# Patient Record
Sex: Male | Born: 1980 | Race: White | Hispanic: No | Marital: Married | Smoking: Never smoker
Health system: Southern US, Community
[De-identification: ages and names within clinical notes are randomized; demographics above are authoritative.]

## PROBLEM LIST (undated history)

## (undated) DIAGNOSIS — F419 Anxiety disorder, unspecified: Secondary | ICD-10-CM

## (undated) DIAGNOSIS — T63301A Toxic effect of unspecified spider venom, accidental (unintentional), initial encounter: Secondary | ICD-10-CM

## (undated) DIAGNOSIS — N200 Calculus of kidney: Secondary | ICD-10-CM

## (undated) DIAGNOSIS — T4145XA Adverse effect of unspecified anesthetic, initial encounter: Secondary | ICD-10-CM

## (undated) DIAGNOSIS — K5090 Crohn's disease, unspecified, without complications: Secondary | ICD-10-CM

## (undated) DIAGNOSIS — K219 Gastro-esophageal reflux disease without esophagitis: Secondary | ICD-10-CM

## (undated) DIAGNOSIS — Z8619 Personal history of other infectious and parasitic diseases: Secondary | ICD-10-CM

## (undated) DIAGNOSIS — T8859XA Other complications of anesthesia, initial encounter: Secondary | ICD-10-CM

## (undated) HISTORY — DX: Gastro-esophageal reflux disease without esophagitis: K21.9

## (undated) HISTORY — DX: Toxic effect of unspecified spider venom, accidental (unintentional), initial encounter: T63.301A

## (undated) HISTORY — DX: Calculus of kidney: N20.0

## (undated) HISTORY — DX: Crohn's disease, unspecified, without complications: K50.90

## (undated) HISTORY — PX: MOLE REMOVAL: SHX2046

## (undated) HISTORY — DX: Personal history of other infectious and parasitic diseases: Z86.19

## (undated) HISTORY — PX: APPENDECTOMY: SHX54

## (undated) HISTORY — DX: Anxiety disorder, unspecified: F41.9

---

## 2001-04-07 ENCOUNTER — Emergency Department (HOSPITAL_COMMUNITY): Admission: EM | Admit: 2001-04-07 | Discharge: 2001-04-07 | Payer: Self-pay | Admitting: Emergency Medicine

## 2004-06-20 ENCOUNTER — Ambulatory Visit: Payer: Self-pay | Admitting: Internal Medicine

## 2005-12-04 ENCOUNTER — Ambulatory Visit: Payer: Self-pay | Admitting: Family Medicine

## 2009-09-02 ENCOUNTER — Inpatient Hospital Stay (HOSPITAL_COMMUNITY): Admission: EM | Admit: 2009-09-02 | Discharge: 2009-09-03 | Payer: Self-pay | Admitting: Emergency Medicine

## 2009-09-02 ENCOUNTER — Encounter (INDEPENDENT_AMBULATORY_CARE_PROVIDER_SITE_OTHER): Payer: Self-pay

## 2010-03-20 HISTORY — PX: COLON SURGERY: SHX602

## 2010-06-05 LAB — CBC
HCT: 42.7 % (ref 39.0–52.0)
Platelets: 224 10*3/uL (ref 150–400)
RBC: 5.16 MIL/uL (ref 4.22–5.81)
WBC: 15.8 10*3/uL — ABNORMAL HIGH (ref 4.0–10.5)

## 2010-06-05 LAB — BASIC METABOLIC PANEL
BUN: 13 mg/dL (ref 6–23)
GFR calc Af Amer: >60 mL/min (ref >60)
GFR calc non Af Amer: >60 mL/min (ref >60)
Potassium: 3.4 mEq/L — ABNORMAL LOW (ref 3.5–5.1)

## 2010-06-05 LAB — URINALYSIS, ROUTINE W REFLEX MICROSCOPIC
Ketones, ur: 15 mg/dL — AB
Nitrite: NEGATIVE
Specific Gravity, Urine: 1.03 (ref 1.005–1.030)
pH: 6.5 (ref 5.0–8.0)

## 2010-06-05 LAB — DIFFERENTIAL
Eosinophils Relative: 1 % (ref 0–5)
Lymphocytes Relative: 6 % — ABNORMAL LOW (ref 12–46)
Lymphs Abs: 1 10*3/uL (ref 0.7–4.0)
Neutrophils Relative %: 88 % — ABNORMAL HIGH (ref 43–77)

## 2011-01-29 ENCOUNTER — Ambulatory Visit: Payer: Self-pay | Admitting: Family Medicine

## 2011-01-31 ENCOUNTER — Emergency Department (HOSPITAL_COMMUNITY)
Admission: EM | Admit: 2011-01-31 | Discharge: 2011-01-31 | Disposition: A | Discharge status: Home / Self Care | Payer: 59 | Attending: Emergency Medicine | Admitting: Emergency Medicine

## 2011-01-31 ENCOUNTER — Emergency Department (HOSPITAL_COMMUNITY): Payer: 59

## 2011-01-31 ENCOUNTER — Encounter: Payer: Self-pay | Admitting: *Deleted

## 2011-01-31 DIAGNOSIS — R1013 Epigastric pain: Secondary | ICD-10-CM | POA: Insufficient documentation

## 2011-01-31 DIAGNOSIS — R112 Nausea with vomiting, unspecified: Secondary | ICD-10-CM | POA: Insufficient documentation

## 2011-01-31 DIAGNOSIS — R1011 Right upper quadrant pain: Secondary | ICD-10-CM | POA: Insufficient documentation

## 2011-01-31 LAB — DIFFERENTIAL
Eosinophils Relative: 3 % (ref 0–5)
Lymphocytes Relative: 19 % (ref 12–46)
Lymphs Abs: 1.9 10*3/uL (ref 0.7–4.0)
Monocytes Absolute: 0.9 10*3/uL (ref 0.1–1.0)

## 2011-01-31 LAB — COMPREHENSIVE METABOLIC PANEL
CO2: 28 mEq/L (ref 19–32)
Calcium: 9.6 mg/dL (ref 8.4–10.5)
Creatinine, Ser: 1.1 mg/dL (ref 0.50–1.35)
GFR calc Af Amer: >90 mL/min (ref >90)
GFR calc non Af Amer: 89 mL/min — ABNORMAL LOW (ref >90)
Glucose, Bld: 85 mg/dL (ref 70–99)

## 2011-01-31 LAB — CBC
HCT: 46.6 % (ref 39.0–52.0)
MCV: 79.8 fL (ref 78.0–100.0)
RBC: 5.84 MIL/uL — ABNORMAL HIGH (ref 4.22–5.81)
WBC: 9.8 10*3/uL (ref 4.0–10.5)

## 2011-01-31 MED ORDER — HYDROCODONE-ACETAMINOPHEN 5-325 MG PO TABS: 2.0000 | ORAL_TABLET | ORAL | Status: DC | PRN

## 2011-01-31 MED ORDER — FAMOTIDINE 20 MG PO TABS: 20.0000 mg | ORAL_TABLET | Freq: Two times a day (BID) | ORAL | Status: DC

## 2011-01-31 MED ORDER — HYDROMORPHONE HCL PF 1 MG/ML IJ SOLN
1.0000 mg | Freq: Once | INTRAMUSCULAR | Status: DC
  Filled 2011-01-31: qty 1

## 2011-01-31 MED ORDER — ONDANSETRON HCL 4 MG PO TABS: 8.0000 mg | ORAL_TABLET | Freq: Three times a day (TID) | ORAL | Status: DC | PRN

## 2011-01-31 MED ORDER — ONDANSETRON 4 MG PO TBDP
8.0000 mg | ORAL_TABLET | Freq: Once | ORAL | Status: DC
  Filled 2011-01-31: qty 1

## 2011-01-31 NOTE — ED Notes (Signed)
Pt c/o epigastric abd pain x 3 weeks; worse when lying or after eating. Was seen at [facility] yesterday for lab draw - does not know the results.

## 2011-01-31 NOTE — ED Provider Notes (Signed)
History     CSN: [id number] Arrival date & time: 01/31/2011  9:53 AM   First MD Initiated Contact with Patient 01/31/11 1035      Chief Complaint  Patient presents with  . Abdominal Pain    epigastric   Patient is a 30 y.o. male presenting with abdominal pain.  Abdominal Pain The primary symptoms of the illness include abdominal pain, nausea and vomiting. The primary symptoms of the illness do not include fever, shortness of breath, diarrhea, hematemesis or dysuria.  Symptoms associated with the illness do not include chills, diaphoresis, constipation, urgency or back pain.   Patient seen and evaluated at 10 am. Patient reports that He has had epigastric abdominal pain for the past three weeks. Worse with lying down or after eating. Patient was seen at the urgent care in [place] for this as well. Denies that the pain is exertional in nature denies any exertional dyspnea. Patient reports some associated nausea. Patient states that the pain started last night after eating a greasy meal. Pain is located in the epigastric to right upper quadrant. Patient denies any diarrhea or rectal bleeding. Patient denies any fevers or chills. Patient denies any other complaints. History reviewed. No pertinent past medical history.  Past Surgical History  Procedure Date  . Appendectomy     History reviewed. No pertinent family history.  History  Substance Use Topics  . Smoking status: Never Smoker   . Smokeless tobacco: Not on file  . Alcohol Use: No      Review of Systems  Constitutional: Negative for fever, chills, diaphoresis and appetite change.  HENT: Negative for neck pain.   Eyes: Negative for photophobia and visual disturbance.  Respiratory: Negative for cough, chest tightness and shortness of breath.   Cardiovascular: Negative for chest pain.  Gastrointestinal: Positive for nausea, vomiting and abdominal pain. Negative for diarrhea, constipation and hematemesis.  Genitourinary:  Negative for dysuria, urgency, flank pain, decreased urine volume, discharge, penile swelling, scrotal swelling, difficulty urinating, penile pain and testicular pain.  Musculoskeletal: Negative for back pain.  Skin: Negative for rash.  Neurological: Negative for weakness and numbness.  All other systems reviewed and are negative.    Allergies  Review of patient's allergies indicates no known allergies.  Home Medications  No current outpatient prescriptions on file.  BP 115/62  Pulse 72  Temp(Src) 98 F (36.7 C) (Oral)  Resp 12  SpO2 100%  Physical Exam  Nursing note and vitals reviewed. Constitutional: He is oriented to person, place, and time. He appears well-developed and well-nourished. No distress.  HENT:  Head: Normocephalic and atraumatic.  Eyes: EOM are normal. Pupils are equal, round, and reactive to light.  Neck: Normal range of motion. Neck supple. No JVD present. No tracheal deviation present.  Cardiovascular: Normal rate and regular rhythm.  Exam reveals no gallop and no friction rub.   No murmur heard. Pulmonary/Chest: Effort normal and breath sounds normal. No respiratory distress. He has no wheezes. He exhibits no tenderness.  Abdominal: Soft. Bowel sounds are normal. There is no hepatosplenomegaly, splenomegaly or hepatomegaly. There is tenderness in the right upper quadrant. There is positive Murphy's sign. There is no rebound, no CVA tenderness and no tenderness at McBurney's point. No hernia. Hernia confirmed negative in the ventral area, confirmed negative in the right inguinal area and confirmed negative in the left inguinal area.       Tenderness is mild. No appendix. Abdominal scar noted at the umbilicus.  Genitourinary: Prostate normal. No  penile tenderness.       Chaperone present.  Musculoskeletal: Normal range of motion.  Lymphadenopathy:    He has no cervical adenopathy.  Neurological: He is alert and oriented to person, place, and time.  Skin: Skin  is warm and dry. No rash noted. He is not diaphoretic. No erythema. No pallor.  Psychiatric: He has a normal mood and affect. His behavior is normal. Judgment and thought content normal.    ED Course  Procedures (including critical care time)  Patient seen and evaluated.  VSS reviewed. . Nursing notes reviewed. Discussed with attending physician. Initial testing ordered. Will monitor the patient closely. They agree with the treatment plan and diagnosis. [place] of abdomen ordered to r/o gallstones.  Patient seen and re-evaluated. Resting comfortably. VSS stable. NAD. Patient notified of testing results. Stated agreement and understanding. Patient stated understanding to treatment plan and diagnosis.   1:27 PM patient seen and re-evaluated. Resting comfortably. No abnormalities on labs or imaging. No tenderness to palpation on re-examination. Will have patient follow up with his primary care physician or GI specialist for further evaluation. Patient advised of warning signs to return. Stated agreement and understanding.  Patient Vitals for the past 24 hrs:  BP Temp Temp src Pulse Resp SpO2  01/31/11 0955 115/62 mmHg 98 F (36.7 C) Oral 72  12  100 %   Results for orders placed during the hospital encounter of 01/31/11  CBC      Component Value Range   WBC 9.8  4.0 - 10.5 (K/uL)   RBC 5.84 (*) 4.22 - 5.81 (MIL/uL)   Hemoglobin 15.9  13.0 - 17.0 (g/dL)   HCT 46.6  39.0 - 52.0 (%)   MCV 79.8  78.0 - 100.0 (fL)   MCH 27.2  26.0 - 34.0 (pg)   MCHC 34.1  30.0 - 36.0 (g/dL)   RDW 12.9  11.5 - 15.5 (%)   Platelets 256  150 - 400 (K/uL)  DIFFERENTIAL      Component Value Range   Neutrophils Relative 69  43 - 77 (%)   Neutro Abs 6.8  1.7 - 7.7 (K/uL)   Lymphocytes Relative 19  12 - 46 (%)   Lymphs Abs 1.9  0.7 - 4.0 (K/uL)   Monocytes Relative 9  3 - 12 (%)   Monocytes Absolute 0.9  0.1 - 1.0 (K/uL)   Eosinophils Relative 3  0 - 5 (%)   Eosinophils Absolute 0.3  0.0 - 0.7 (K/uL)   Basophils  Relative 0  0 - 1 (%)   Basophils Absolute 0.0  0.0 - 0.1 (K/uL)  COMPREHENSIVE METABOLIC PANEL      Component Value Range   Sodium 138  135 - 145 (mEq/L)   Potassium 3.8  3.5 - 5.1 (mEq/L)   Chloride 101  96 - 112 (mEq/L)   CO2 28  19 - 32 (mEq/L)   Glucose, Bld 85  70 - 99 (mg/dL)   BUN 14  6 - 23 (mg/dL)   Creatinine, Ser 1.10  0.50 - 1.35 (mg/dL)   Calcium 9.6  8.4 - 10.5 (mg/dL)   Total Protein 7.8  6.0 - 8.3 (g/dL)   Albumin 3.7  3.5 - 5.2 (g/dL)   AST 15  0 - 37 (U/L)   ALT 19  0 - 53 (U/L)   Alkaline Phosphatase 83  39 - 117 (U/L)   Total Bilirubin 0.5  0.3 - 1.2 (mg/dL)   GFR calc non Af Amer 89 (*) >90 (  mL/min)   GFR calc Af Amer >90  >90 (mL/min)  LIPASE, BLOOD      Component Value Range   Lipase 25  11 - 59 (U/L)   US Abdomen Complete  01/31/2011  *RADIOLOGY REPORT*  Clinical Data:  Right upper quadrant pain.  ABDOMINAL ULTRASOUND COMPLETE  Comparison:  None.  Findings:  Gallbladder:  No gallstones, gallbladder wall thickening, or pericholecystic fluid.  Common Bile Duct:  Within normal limits in caliber. Measures 5 mm in diameter.  Liver: No focal mass lesion identified.  Within normal limits in parenchymal echogenicity.  IVC:  Appears normal.  Pancreas:  No abnormality identified.  Spleen:  Within normal limits in size and echotexture.  Right kidney:  Normal in size and parenchymal echogenicity.  No evidence of mass or hydronephrosis.  Left kidney:  Normal in size and parenchymal echogenicity.  No evidence of mass or hydronephrosis.  Abdominal Aorta:  No aneurysm identified.  IMPRESSION: Negative abdominal ultrasound.  Original Report Authenticated By: Marlaine Hind, M.D.      MDM  Epigastric abdominal pain        Benson Setting, [place] 01/31/11 1330

## 2011-01-31 NOTE — ED Notes (Signed)
PT REFUSES PAIN AND NAUSEA MED AT THIS TIME.

## 2011-01-31 NOTE — ED Notes (Signed)
PATIENT REPORTS HE HAS BEEN HAVING INTERMITTENT EPIGASTRIC PAIN FOR THE PAST 3-4 WEEKS. STATES USUALLY STARTS AFTER HE EATS LUNCH.[place] WITH FRIED FOODS . STATES SEEMS TO BE WORSE AT NIGHT WHEN HE TRIES TO REST. DENIES NAUSEA OR VOMITING OR DIARRHEA WITH THE PAIN.DENIES FEVER. MUCOUS MEMBRANES ARE PINK AND MOIST. ABDOMEN SOFT AND NONTENDER. BOWEL SOUNDS ACTIVE. DENIES ANY PAIN OR SYMPTOMS AT THIS TIME

## 2011-02-01 ENCOUNTER — Encounter: Payer: Self-pay | Admitting: Internal Medicine

## 2011-02-01 NOTE — ED Provider Notes (Signed)
Medical screening examination/treatment/procedure(s) were performed by non-physician practitioner and as supervising physician I was immediately available for consultation/collaboration.  Virgel Manifold, MD 02/01/11 [phone]

## 2011-02-05 ENCOUNTER — Encounter (HOSPITAL_COMMUNITY): Payer: Self-pay | Admitting: Emergency Medicine

## 2011-02-05 ENCOUNTER — Emergency Department (HOSPITAL_COMMUNITY): Payer: 59

## 2011-02-05 ENCOUNTER — Emergency Department (HOSPITAL_COMMUNITY)
Admission: EM | Admit: 2011-02-05 | Discharge: 2011-02-05 | Disposition: A | Discharge status: Home / Self Care | Payer: 59 | Attending: Emergency Medicine | Admitting: Emergency Medicine

## 2011-02-05 DIAGNOSIS — K5289 Other specified noninfective gastroenteritis and colitis: Secondary | ICD-10-CM | POA: Insufficient documentation

## 2011-02-05 DIAGNOSIS — R634 Abnormal weight loss: Secondary | ICD-10-CM | POA: Insufficient documentation

## 2011-02-05 DIAGNOSIS — R1011 Right upper quadrant pain: Secondary | ICD-10-CM | POA: Insufficient documentation

## 2011-02-05 DIAGNOSIS — K529 Noninfective gastroenteritis and colitis, unspecified: Secondary | ICD-10-CM

## 2011-02-05 DIAGNOSIS — R110 Nausea: Secondary | ICD-10-CM | POA: Insufficient documentation

## 2011-02-05 LAB — COMPREHENSIVE METABOLIC PANEL
ALT: 26 U/L (ref 0–53)
AST: 21 U/L (ref 0–37)
Albumin: 3.8 g/dL (ref 3.5–5.2)
CO2: 27 mEq/L (ref 19–32)
Calcium: 9.5 mg/dL (ref 8.4–10.5)
GFR calc non Af Amer: >90 mL/min (ref >90)
Sodium: 139 mEq/L (ref 135–145)

## 2011-02-05 LAB — CBC
MCH: 27.6 pg (ref 26.0–34.0)
Platelets: 271 10*3/uL (ref 150–400)
RBC: 5.97 MIL/uL — ABNORMAL HIGH (ref 4.22–5.81)
WBC: 13.1 10*3/uL — ABNORMAL HIGH (ref 4.0–10.5)

## 2011-02-05 MED ORDER — GI COCKTAIL ~~LOC~~
30.0000 mL | Freq: Once | ORAL | Status: AC
  Administered 2011-02-05: 30 mL via ORAL
  Filled 2011-02-05: qty 30

## 2011-02-05 MED ORDER — IOHEXOL 300 MG/ML  SOLN
100.0000 mL | Freq: Once | INTRAMUSCULAR | Status: AC | PRN
  Administered 2011-02-05: 100 mL via INTRAVENOUS

## 2011-02-05 MED ORDER — ONDANSETRON HCL 4 MG/2ML IJ SOLN
4.0000 mg | Freq: Once | INTRAMUSCULAR | Status: AC
  Administered 2011-02-05: 4 mg via INTRAVENOUS
  Filled 2011-02-05: qty 2

## 2011-02-05 MED ORDER — OXYCODONE-ACETAMINOPHEN 5-325 MG PO TABS
2.0000 | ORAL_TABLET | Freq: Once | ORAL | Status: AC
  Administered 2011-02-05: 2 via ORAL
  Filled 2011-02-05: qty 2

## 2011-02-05 MED ORDER — OXYCODONE-ACETAMINOPHEN 5-325 MG PO TABS: 2.0000 | ORAL_TABLET | ORAL | Status: DC | PRN

## 2011-02-05 MED ORDER — SODIUM CHLORIDE 0.9 % IV SOLN
Freq: Once | INTRAVENOUS | Status: AC
  Administered 2011-02-05: 06:00:00 via INTRAVENOUS

## 2011-02-05 MED ORDER — SODIUM CHLORIDE 0.9 % IV BOLUS (SEPSIS)
1000.0000 mL | Freq: Once | INTRAVENOUS | Status: AC
  Administered 2011-02-05: 1000 mL via INTRAVENOUS

## 2011-02-05 MED ORDER — MORPHINE SULFATE 4 MG/ML IJ SOLN
4.0000 mg | Freq: Once | INTRAMUSCULAR | Status: AC
  Administered 2011-02-05: 4 mg via INTRAVENOUS
  Filled 2011-02-05: qty 1

## 2011-02-05 NOTE — ED Notes (Signed)
See blank note

## 2011-02-05 NOTE — ED Notes (Signed)
Received report from [name], Therapist, sports. Pt has finished drinking contrast and is waiting to go for ct scan of abd/pelvis

## 2011-02-05 NOTE — ED Provider Notes (Signed)
History     CSN: [id number] Arrival date & time: 02/05/2011  3:55 AM   None     Chief Complaint  Patient presents with  . Abdominal Pain    (Consider location/radiation/quality/duration/timing/severity/associated sxs/prior treatment) HPI History provided by pt.   Pt c/o severe, intermittent RUQ pain x 1 month.  In the past week, pain has started to radiate straight through to back and right scapula and is now associated w/ nausea.  Pain worse at night when he lays down and is also aggravated by eating.  Has lost 10lbs in past month because afraid to eat.  Some relief w/ percocet which his father has given him.  Denies fever, cough, SOB, vomiting, diarrhea, blood in stool, urinary sx.  Past abd surgeries include appendectomy.  Does not drink alcohol.  Per prior chart, pt seen for same in ED on 01/31/11 and had a nml abd [place].  Referred to GI and appt scheduled for 02/24/11.  History reviewed. No pertinent past medical history.  Past Surgical History  Procedure Date  . Appendectomy     History reviewed. No pertinent family history.  History  Substance Use Topics  . Smoking status: Never Smoker   . Smokeless tobacco: Not on file  . Alcohol Use: No      Review of Systems  All other systems reviewed and are negative.    Allergies  Review of patient's allergies indicates no known allergies.  Home Medications   Current Outpatient Rx  Name Route Sig Dispense Refill  . FAMOTIDINE 20 MG PO TABS Oral Take 1 tablet (20 mg total) by mouth 2 (two) times daily. 30 tablet 0  . HYDROCODONE-ACETAMINOPHEN 5-325 MG PO TABS Oral Take 2 tablets by mouth every 4 (four) hours as needed for pain. 6 tablet 0    BP 118/65  Pulse 84  Temp(Src) 97.6 F (36.4 C) (Oral)  Resp 20  Ht 6' (1.829 m)  Wt 156 lb (70.761 kg)  BMI 21.16 kg/m2  SpO2 100%  Physical Exam  Nursing note and vitals reviewed. Constitutional: He is oriented to person, place, and time. He appears well-developed and  well-nourished. No distress.  HENT:  Head: Normocephalic and atraumatic.  Eyes:       Normal appearance  Neck: Normal range of motion.  Cardiovascular: Normal rate and regular rhythm.   Pulmonary/Chest: Effort normal and breath sounds normal.  Abdominal: Soft. Bowel sounds are normal. He exhibits no distension and no mass. There is no rebound.       Surgical scar inferior to umbilicus.  Pt points to pain RUQ.  Exam limited b/c pt will not relax abd muscles.  Tenderness RUQ and RLQ only.  No CVA tenderness.    Neurological: He is alert and oriented to person, place, and time.  Skin: Skin is warm and dry. No rash noted.  Psychiatric: He has a normal mood and affect. His behavior is normal.    ED Course  Procedures (including critical care time)  Labs Reviewed  CBC - Abnormal; Notable for the following:    WBC 13.1 (*)    RBC 5.97 (*)    All other components within normal limits  COMPREHENSIVE METABOLIC PANEL  LIPASE, BLOOD   Ct Abdomen Pelvis W Contrast  02/05/2011  *RADIOLOGY REPORT*  Clinical Data: Progressive right epigastric pain  CT ABDOMEN AND PELVIS WITH CONTRAST  Technique:  Multidetector CT imaging of the abdomen and pelvis was performed following the standard protocol during bolus administration of intravenous contrast.  Contrast:  100 ml Omnipaque-300 IV  Comparison: None.  Findings: Visualized lung bases clear.  Gallbladder is physiologically distended.  There is mild prominence of intrahepatic biliary ducts in the lateral left hepatic segment. The common duct appears decompressed throughout its length.  No focal liver lesion.  Unremarkable spleen, adrenal glands, pancreas, kidneys, abdominal aorta.  Portal vein is patent.  Stomach and proximal small bowel are decompressed, unremarkable.  There is circumferential marked wall thickening in the distal terminal ileum extending over a length of approximately  15 - 20 cm to the level of the ileocecal valve.  This results in moderate  luminal narrowing several cm proximal to the ileocecal valve. However, the oral contrast does pass on distally into the colon.  There   is a 3cm focus of inflammatory/edematous change medial to the involved small bowel segment, with a few scattered extraluminal gas bubbles and fluid but no discrete drainable abscess.  There are regional enlarged mesenteric lymph nodes.  No free air.  There is a trace amount of pelvic ascites.  The colon is decompressed, unremarkable.  Urinary bladder is physiologically distended.  No pelvic adenopathy.  There are a few prominent sub centimeter left para-aortic lymph nodes.  Regional bones unremarkable.  IMPRESSION:  1.  Long segment of abnormal distal ileum with circumferential wall thickening and contained perforation but no discrete drainable abscess or high-grade obstruction.  Findings suggest inflammatory bowel disease.  Small bowel lymphoma could have a similar appearance.  Follow up recommended.  Original Report Authenticated By: Dillard Cannon III, M.D.     1. Inflammatory bowel disease       MDM  Pt presents to ED for second time in one week w/ RUQ pain.  Associated w/ nausea and weight loss. Neg abd [place] last visit.  Appt w/ GI 12/7.  On exam, afebrile, NAD, lungs CTA, RUQ/RLQ ttp, no CVA tenderness.  Mild leukocytosis but labs otherwise unremarkable.  Pt received a GI cocktail w/out relief.  He declines any more pain medication at this time.  Discussed w/ Dr. Thad Ranger who recommends CT abd/pelvis.  Will move pt to CDU.  7:16 AM        Remer Macho, PA 02/05/11 1912

## 2011-02-05 NOTE — ED Notes (Signed)
Waiting for ct results to come back. Pt resting at bedside with family at bedside.

## 2011-02-05 NOTE — ED Notes (Signed)
Patient presents with c/o pain to right epigastric area.  Has been getting worse over the alst month or so  Has been seen here and urgent care for the same.  States pain is worse after eating fried food

## 2011-02-05 NOTE — ED Notes (Signed)
Patient transported to CT 

## 2011-02-05 NOTE — ED Provider Notes (Signed)
Medical screening examination/treatment/procedure(s) were performed by non-physician practitioner and as supervising physician I was immediately available for consultation/collaboration.  Babette Relic, MD 02/05/11 [phone]

## 2011-02-05 NOTE — ED Notes (Signed)
Patient  States his pain is some better but then it gets real bad again

## 2011-02-05 NOTE — ED Provider Notes (Signed)
History     CSN: [id number] Arrival date & time: 02/05/2011  3:55 AM   First MD Initiated Contact with Patient 02/05/11 [phone]      Chief Complaint  Patient presents with  . Abdominal Pain    (Consider location/radiation/quality/duration/timing/severity/associated sxs/prior treatment) HPI  History reviewed. No pertinent past medical history.  Past Surgical History  Procedure Date  . Appendectomy     History reviewed. No pertinent family history.  History  Substance Use Topics  . Smoking status: Never Smoker   . Smokeless tobacco: Not on file  . Alcohol Use: No      Review of Systems  Allergies  Review of patient's allergies indicates no known allergies.  Home Medications   Current Outpatient Rx  Name Route Sig Dispense Refill  . FAMOTIDINE 20 MG PO TABS Oral Take 1 tablet (20 mg total) by mouth 2 (two) times daily. 30 tablet 0  . HYDROCODONE-ACETAMINOPHEN 5-325 MG PO TABS Oral Take 2 tablets by mouth every 4 (four) hours as needed for pain. 6 tablet 0    BP 109/68  Pulse 82  Temp(Src) 97.6 F (36.4 C) (Oral)  Resp 18  Ht 6' (1.829 m)  Wt 156 lb (70.761 kg)  BMI 21.16 kg/m2  SpO2 98%  Physical Exam  ED Course  Procedures (including critical care time)  Labs Reviewed  CBC - Abnormal; Notable for the following:    WBC 13.1 (*)    RBC 5.97 (*)    All other components within normal limits  COMPREHENSIVE METABOLIC PANEL  LIPASE, BLOOD   No results found.   Inflammatory bowel disease vs. lymphoma   MDM   I have discussed the results of the CT scan with Dr. Benson Norway with [facility].  He does not feel that we should place the patient on steroids at this time, as we do not know what is going on with the patient.  The patient clinically looks well, is able to tolerate po's so will send him home with stronger pain control, diet plan and for him to call Dr. Ulyses Amor office tomorrow to try and get an earlier appointment.       Idalia Needle [place],  [place] 02/05/11 1130

## 2011-02-06 NOTE — ED Provider Notes (Signed)
Medical screening examination/treatment/procedure(s) were performed by non-physician practitioner and as supervising physician I was immediately available for consultation/collaboration.   Lezlie Octave, MD 02/06/11 [phone]

## 2011-02-07 ENCOUNTER — Inpatient Hospital Stay (HOSPITAL_COMMUNITY)
Admission: AD | Admit: 2011-02-07 | Discharge: 2011-02-10 | DRG: 387 | Disposition: A | Discharge status: Home / Self Care | Payer: 59 | Source: Ambulatory Visit | Attending: Internal Medicine | Admitting: Internal Medicine

## 2011-02-07 ENCOUNTER — Ambulatory Visit (INDEPENDENT_AMBULATORY_CARE_PROVIDER_SITE_OTHER): Payer: 59 | Admitting: Gastroenterology

## 2011-02-07 ENCOUNTER — Encounter (HOSPITAL_COMMUNITY): Payer: Self-pay | Admitting: *Deleted

## 2011-02-07 ENCOUNTER — Encounter: Payer: Self-pay | Admitting: Gastroenterology

## 2011-02-07 VITALS — BP 108/68 | HR 80 | Ht 72.0 in | Wt 153.6 lb

## 2011-02-07 DIAGNOSIS — D72829 Elevated white blood cell count, unspecified: Secondary | ICD-10-CM | POA: Diagnosis present

## 2011-02-07 DIAGNOSIS — K5090 Crohn's disease, unspecified, without complications: Principal | ICD-10-CM | POA: Diagnosis present

## 2011-02-07 DIAGNOSIS — R1031 Right lower quadrant pain: Secondary | ICD-10-CM

## 2011-02-07 DIAGNOSIS — R933 Abnormal findings on diagnostic imaging of other parts of digestive tract: Secondary | ICD-10-CM

## 2011-02-07 HISTORY — DX: Other complications of anesthesia, initial encounter: T88.59XA

## 2011-02-07 HISTORY — DX: Adverse effect of unspecified anesthetic, initial encounter: T41.45XA

## 2011-02-07 LAB — COMPREHENSIVE METABOLIC PANEL
ALT: 23 U/L (ref 0–53)
AST: 17 U/L (ref 0–37)
Albumin: 3.5 g/dL (ref 3.5–5.2)
Calcium: 9.5 mg/dL (ref 8.4–10.5)
Chloride: 98 mEq/L (ref 96–112)
Creatinine, Ser: 1.16 mg/dL (ref 0.50–1.35)
Sodium: 136 mEq/L (ref 135–145)
Total Bilirubin: 0.3 mg/dL (ref 0.3–1.2)

## 2011-02-07 LAB — CBC
Hemoglobin: 15 g/dL (ref 13.0–17.0)
MCH: 26.6 pg (ref 26.0–34.0)
MCV: 79.1 fL (ref 78.0–100.0)
RBC: 5.64 MIL/uL (ref 4.22–5.81)

## 2011-02-07 LAB — SEDIMENTATION RATE: Sed Rate: 13 mm/hr (ref 0–16)

## 2011-02-07 MED ORDER — PIPERACILLIN-TAZOBACTAM 3.375 G IVPB
3.3750 g | Freq: Three times a day (TID) | INTRAVENOUS | Status: DC
  Administered 2011-02-07 – 2011-02-10 (×9): 3.375 g via INTRAVENOUS
  Filled 2011-02-07 (×12): qty 50

## 2011-02-07 MED ORDER — METHYLPREDNISOLONE SODIUM SUCC 40 MG IJ SOLR
40.0000 mg | Freq: Every day | INTRAMUSCULAR | Status: DC
  Administered 2011-02-07 – 2011-02-10 (×4): 40 mg via INTRAVENOUS
  Filled 2011-02-07 (×4): qty 1

## 2011-02-07 MED ORDER — ACETAMINOPHEN 650 MG RE SUPP: 650.0000 mg | Freq: Four times a day (QID) | RECTAL | Status: DC | PRN

## 2011-02-07 MED ORDER — ONDANSETRON HCL 4 MG PO TABS: 4.0000 mg | ORAL_TABLET | Freq: Four times a day (QID) | ORAL | Status: DC | PRN

## 2011-02-07 MED ORDER — HYDROMORPHONE HCL PF 2 MG/ML IJ SOLN
1.0000 mg | INTRAMUSCULAR | Status: DC | PRN
  Administered 2011-02-07 – 2011-02-08 (×2): 1 mg via INTRAVENOUS
  Filled 2011-02-07 (×2): qty 1

## 2011-02-07 MED ORDER — ACETAMINOPHEN 325 MG PO TABS
650.0000 mg | ORAL_TABLET | Freq: Four times a day (QID) | ORAL | Status: DC | PRN
  Administered 2011-02-08: 650 mg via ORAL
  Filled 2011-02-07 (×2): qty 2

## 2011-02-07 MED ORDER — KCL IN DEXTROSE-NACL 10-5-0.45 MEQ/L-%-% IV SOLN
INTRAVENOUS | Status: DC
  Administered 2011-02-07 – 2011-02-09 (×3): via INTRAVENOUS
  Filled 2011-02-07 (×9): qty 1000

## 2011-02-07 MED ORDER — HYDROMORPHONE HCL PF 2 MG/ML IJ SOLN
INTRAMUSCULAR | Status: AC
  Filled 2011-02-07: qty 1

## 2011-02-07 MED ORDER — ONDANSETRON HCL 4 MG/2ML IJ SOLN: 4.0000 mg | Freq: Four times a day (QID) | INTRAMUSCULAR | Status: DC | PRN

## 2011-02-07 NOTE — H&P (Signed)
  History of Present Illness:  Mr. Wickens is a 30 year old white male referred from the ER for evaluation of abdominal pain. Approximately a month ago he developed gradual onset of lower abdominal pain, right greater than left. Pain has intensified and now it is severe. He was seen in the ER on February 05, 2011 where CT scan, which I reviewed, demonstrated a long segment of terminal ileum with circumferential wall thickening .  There is trace inflammatory changes in the mesentery. There appeared to be a small amount of luminal gas.  White count was 13.1, hemoglobin 16 and MCV 79. He was discharged on analgesics. His pain continues and is worsened with eating. He's had constipation since taking pain medicines. He denies melena or hematochezia.   History reviewed. No pertinent past medical history. Past Surgical History  Procedure Date  . Appendectomy    family history includes Skin cancer in his mother. Current Outpatient Prescriptions  Medication Sig Dispense Refill  . oxyCODONE-acetaminophen (PERCOCET) 5-325 MG per tablet Take 2 tablets by mouth every 4 (four) hours as needed for pain.  60 tablet  0  . famotidine (PEPCID) 20 MG tablet Take 1 tablet (20 mg total) by mouth 2 (two) times daily.  30 tablet  0  . HYDROcodone-acetaminophen (NORCO) 5-325 MG per tablet Take 2 tablets by mouth every 4 (four) hours as needed for pain.  6 tablet  0   Allergies as of 02/07/2011  . (No Known Allergies)    reports that he has never smoked. He has never used smokeless tobacco. He reports that he does not drink alcohol or use illicit drugs.     Review of Systems: Pertinent positive and negative review of systems were noted in the above HPI section. All other review of systems were otherwise negative.  Vital signs were reviewed in today's medical record Physical Exam: General: Well developed , well nourished,  but ill-appearing  Head: Normocephalic and atraumatic Eyes:  sclerae anicteric, EOMI Ears:  Normal auditory acuity Mouth: No deformity or lesions Neck: Supple, no masses or thyromegaly Lungs: Clear throughout to auscultation Heart: Regular rate and rhythm; no murmurs, rubs or bruits Abdomen: Soft,and non distended. No masses, hepatosplenomegaly or hernias noted. Normal Bowel sounds; there is marked tenderness to palpation in the right greater than left lower quadrants with guarding and slight rebound Rectal:deferred Musculoskeletal: Symmetrical with no gross deformities  Skin: No lesions on visible extremities Pulses:  Normal pulses noted Extremities: No clubbing, cyanosis, edema or deformities noted Neurological: Alert oriented x 4, grossly nonfocal Cervical Nodes:  No significant cervical adenopathy Inguinal Nodes: No significant inguinal adenopathy Psychological:  Alert and cooperative. Normal mood and affect  Impression 30 year old male with one month history of increasing lower bowel pain and CT changes suggestive of inflammatory bowel disease with contained perforation. Differential diagnosis includes small bowel lymphoma (he is status post appendectomy a year and a half ago).    Recommendations #1 bowel rest #2 IV antibiotics, either Zosyn or Cipro and Flagyl #3 begin Solu-Medrol 28m daily #4 parenteral analgesics #5 check inflammatory markers including CRP and ESR; CBC and comprehensive of metabolic profile #4 if patient is not improved within the next 24 hours Will obtain a surgical consult  RInda CastleMD, FFlorence Surgery Center LP

## 2011-02-07 NOTE — Progress Notes (Signed)
History of Present Illness: 30 year old white male with one-month history of increasing lower pelvic pain. At his ER visit CT scan showed a long segment of thickened terminal ileum with a few extraluminal gas bubbles. Pain continues. He is without fever.    History reviewed. No pertinent past medical history. Past Surgical History  Procedure Date  . Appendectomy    family history includes Skin cancer in his mother. Current Outpatient Prescriptions  Medication Sig Dispense Refill  . oxyCODONE-acetaminophen (PERCOCET) 5-325 MG per tablet Take 2 tablets by mouth every 4 (four) hours as needed for pain.  60 tablet  0  . famotidine (PEPCID) 20 MG tablet Take 1 tablet (20 mg total) by mouth 2 (two) times daily.  30 tablet  0  . HYDROcodone-acetaminophen (NORCO) 5-325 MG per tablet Take 2 tablets by mouth every 4 (four) hours as needed for pain.  6 tablet  0   Allergies as of 02/07/2011  . (No Known Allergies)    reports that he has never smoked. He has never used smokeless tobacco. He reports that he does not drink alcohol or use illicit drugs.     Review of Systems: Pertinent positive and negative review of systems were noted in the above HPI section. All other review of systems were otherwise negative.  Vital signs were reviewed in today's medical record Physical Exam: General: Well developed , well nourished, no acute distress Head: Normocephalic and atraumatic Eyes:  sclerae anicteric, EOMI Ears: Normal auditory acuity Mouth: No deformity or lesions Neck: Supple, no masses or thyromegaly Lungs: Clear throughout to auscultation Heart: Regular rate and rhythm; no murmurs, rubs or bruits Abdomen: Soft,and non distended. No masses, hepatosplenomegaly or hernias noted. Normal Bowel sounds; there is marked tenderness in the lower quadrants bilaterally with guarding and rebound Rectal:deferred Musculoskeletal: Symmetrical with no gross deformities  Skin: No lesions on visible  extremities Pulses:  Normal pulses noted Extremities: No clubbing, cyanosis, edema or deformities noted Neurological: Alert oriented x 4, grossly nonfocal Cervical Nodes:  No significant cervical adenopathy Inguinal Nodes: No significant inguinal adenopathy Psychological:  Alert and cooperative. Normal mood and affect

## 2011-02-07 NOTE — Assessment & Plan Note (Signed)
I strongly suspect that he has an acute inflammatory process is most likely due to inflammatory bowel disease. Extraluminal air raises the question of inflammatory phlegmon with possible contained perforation. Other possibilities include lymphoma  Plan to admit patient for parenteral antibiotics, analgesia and steroids.

## 2011-02-07 NOTE — Patient Instructions (Signed)
We will be admitting you today to [facility] on [address] and go to the admitting office 1st floor

## 2011-02-07 NOTE — Progress Notes (Signed)
MD placed order for 'place PPD' under labs.  RN spoke with pharmacist and pharmacy said that they could not re-enter for medication and that the MD needed to clarify to make sure that it was not checked in error.  Will check with the provider to make sure that the order is correct for the patient and that the order is re-entered as a medication.

## 2011-02-07 NOTE — Progress Notes (Signed)
Patient denies being a smoker. Smoking cessation counseling not needed.

## 2011-02-08 DIAGNOSIS — R1031 Right lower quadrant pain: Secondary | ICD-10-CM

## 2011-02-08 DIAGNOSIS — R933 Abnormal findings on diagnostic imaging of other parts of digestive tract: Secondary | ICD-10-CM

## 2011-02-08 LAB — VITAMIN B12: Vitamin B-12: 700 pg/mL (ref 211–911)

## 2011-02-08 LAB — IRON AND TIBC
Iron: 60 ug/dL (ref 42–135)
TIBC: 279 ug/dL (ref 215–435)

## 2011-02-08 LAB — FOLATE: Folate: 15.9 ng/mL

## 2011-02-08 NOTE — Progress Notes (Signed)
INITIAL ADULT NUTRITION ASSESSMENT Date: 02/08/2011   Time: 12:02 PM Reason for Assessment: Health history  ASSESSMENT: Male 30 y.o.  Dx: Abdominal pain  Hx:  Past Medical History  Diagnosis Date  . Complication of anesthesia     "mental recovery"   Related Meds:  Scheduled Meds:   . methylPREDNISolone (SOLU-MEDROL) injection  40 mg Intravenous Daily  . piperacillin-tazobactam (ZOSYN)  IV  3.375 g Intravenous Q8H   Continuous Infusions:   . dextrose 5 % and 0.45 % NaCl with KCl 10 mEq/L 100 mL/hr at 02/08/11 0600   PRN Meds:.acetaminophen, HYDROmorphone, ondansetron (ZOFRAN) IV, DISCONTD: acetaminophen, DISCONTD: ondansetron  Ht: 5' 10"  (177.8 cm)  Wt: 154 lb (69.854 kg)  Ideal Wt: 75.4kg % Ideal Wt: 92  Usual Wt: 75.9kg % Usual Wt: 92  Body mass index is 22.10 kg/(m^2).  Food/Nutrition Related Hx: Pt reports poor intake for the past month r/t abdominal pain with 13 pound unintentional weight loss. Pt denies any nausea/vomiting during this time frame. Pt reports some improvement in abdominal pain today. Pt reports doing well with clear liquids.   CT of abdomen/pelvis on 11/18 showed long segment of abnormal distal ileum with circumferential wall thickening and contained perforation but no discrete drainable abscess or high-grade obstruction. Findings suggest inflammatory bowel disease. Small bowel lymphoma could have a similar appearance. Abdominal ultrasound on 11/13 was negative.   Labs:  CMP     Component Value Date/Time   NA 136 02/07/2011 1733   K 3.6 02/07/2011 1733   CL 98 02/07/2011 1733   CO2 28 02/07/2011 1733   GLUCOSE 107* 02/07/2011 1733   BUN 13 02/07/2011 1733   CREATININE 1.16 02/07/2011 1733   CALCIUM 9.5 02/07/2011 1733   PROT 7.4 02/07/2011 1733   ALBUMIN 3.5 02/07/2011 1733   AST 17 02/07/2011 1733   ALT 23 02/07/2011 1733   ALKPHOS 92 02/07/2011 1733   BILITOT 0.3 02/07/2011 1733   GFRNONAA 83* 02/07/2011 1733   GFRAA >90 02/07/2011  1733    Intake/Output Summary (Last 24 hours) at 02/08/11 1207 Last data filed at 02/08/11 0600  Gross per 24 hour  Intake    850 ml  Output      0 ml  Net    850 ml   No BM in the past 24 hours   Diet Order: Clear Liquid   IVF:    dextrose 5 % and 0.45 % NaCl with KCl 10 mEq/L Last Rate: 100 mL/hr at 02/08/11 0600    Estimated Nutritional Needs:   Kcal:1700-2100  Protein:70-85g Fluid:1.7-2.1L  NUTRITION DIAGNOSIS: -Inadequate oral intake (NI-2.1).  Status: Ongoing -Pt meets criteria for severe PCM of chronic illness AEB 7% weight loss in the past month and <75% intake in the past month  RELATED TO: abdominal pain  AS EVIDENCE BY: pt statement/unintentional weight loss  MONITORING/EVALUATION(Goals): Advance diet as tolerated to regular diet.   EDUCATION NEEDS: -No education needs identified at this time  INTERVENTION: Diet advancement per MD. Hopefully as pain improves, intake will improve. Will monitor.   Dietitian # [phone]  DOCUMENTATION CODES Per approved criteria  -Severe malnutrition in the context of chronic illness    Robert Jenkins 02/08/2011, 12:02 PM

## 2011-02-08 NOTE — Progress Notes (Signed)
Subjective Abdominal pain improved, slept well, no B.M's  Objective: Vital signs in last 24 hours: Temp:  [97.4 F (36.3 C)-98.3 F (36.8 C)] 97.4 F (36.3 C) (11/21 0615) Pulse Rate:  [69-84] 69  (11/21 0615) Resp:  [18] 18  (11/21 0615) BP: (107-116)/(64-69) 116/64 mmHg (11/21 0615) SpO2:  [95 %-98 %] 97 % (11/21 0615) Weight:  [69.673 kg (153 lb 9.6 oz)-69.854 kg (154 lb)] 154 lb (69.854 kg) (11/20 1630) Last BM Date: 02/07/11 General:   Alert,  Well-developed, well-nourished, pleasant and cooperative in NAD Head:  Normocephalic and atraumatic. Eyes:  Sclera clear, no icterus.   Conjunctiva pink. Mouth:  No deformity or lesions, dentition normal. Neck:  Supple; no masses or thyromegaly. Heart:  Regular rate and rhythm; no murmurs, clicks, rubs,  or gallops. Abdomen:  Active B.S's, very tender RLQ with voluntary guarding, fullness, no fluid wave, no CVAT  Msk:  Symmetrical without gross deformities. Normal posture. Pulses:  Normal pulses noted. Extremities:  Without clubbing or edema. Neurologic:  Alert and  oriented x4;  grossly normal neurologically. Skin:  Intact without significant lesions or rashes. Cervical Nodes:  No significant cervical adenopathy. Psych:  Alert and cooperative. Normal mood and affect.  Intake/Output from previous day: 11/20 0701 - 11/21 0700 In: 850 [I.V.:800; IV Piggyback:50] Out: -  Intake/Output this shift:    Lab Results:  Basename 02/07/11 1733  WBC 13.9*  HGB 15.0  HCT 44.6  PLT 295   BMET  Basename 02/07/11 1733  NA 136  K 3.6  CL 98  CO2 28  GLUCOSE 107*  BUN 13  CREATININE 1.16  CALCIUM 9.5   LFT  Basename 02/07/11 1733  PROT 7.4  ALBUMIN 3.5  AST 17  ALT 23  ALKPHOS 92  BILITOT 0.3  BILIDIR --  IBILI --   PT/INR  Basename 02/07/11 1733  LABPROT 14.0  INR 1.06   Hepatitis Panel No results found for this basename: HEPBSAG,HCVAB,HEPAIGM,HEPBIGM in the last 72 hours   Studies/Results: No results  found.  Assessment: Active Problems:  Abdominal pain, right lower quadrant   Suspected Crohn's disease of the TI as per CT scan of the abdomen, question of confined perforation/inflammatory phlegmon at the distal ileum, elevated WBC  Plan: Continue bowl rest IV steroids ZosynIV, Consider repeating CT scan in few days   LOS: 1 day   Delfin Edis  02/08/2011, 7:12 AM

## 2011-02-09 LAB — CBC
Platelets: 317 10*3/uL (ref 150–400)
RBC: 5.49 MIL/uL (ref 4.22–5.81)
WBC: 11.4 10*3/uL — ABNORMAL HIGH (ref 4.0–10.5)

## 2011-02-09 NOTE — Progress Notes (Signed)
Subjective Much improved, no abd. pain  Objective: Vital signs in last 24 hours: Temp:  [97.2 F (36.2 C)-98.2 F (36.8 C)] 97.2 F (36.2 C) (11/22 0547) Pulse Rate:  [56-74] 56  (11/22 0547) Resp:  [18] 18  (11/22 0547) BP: (90-109)/(54-69) 90/54 mmHg (11/22 0547) SpO2:  [93 %-99 %] 99 % (11/22 0547) Last BM Date: 02/07/11 General:   Alert,  Well-developed, well-nourished, pleasant and cooperative in NAD Head:  Normocephalic and atraumatic. Eyes:  Sclera clear, no icterus.   Conjunctiva pink. Mouth:  No deformity or lesions, dentition normal. Neck:  Supple; no masses or thyromegaly. Heart:  Regular rate and rhythm; no murmurs, clicks, rubs,  or gallops. Abdomen:  Minimal RLQ discomfort, slight fullness in RLQ  Msk:  Symmetrical without gross deformities. Normal posture. Pulses:  Normal pulses noted. Extremities:  Without clubbing or edema. Neurologic:  Alert and  oriented x4;  grossly normal neurologically. Skin:  Intact without significant lesions or rashes. Cervical Nodes:  No significant cervical adenopathy. Psych:  Alert and cooperative. Normal mood and affect.  Intake/Output from previous day: 11/21 0701 - 11/22 0700 In: 2931 [P.O.:481; I.V.:2350; IV Piggyback:100] Out: 500 [Urine:500] Intake/Output this shift:    Lab Results:  Basename 02/09/11 0535 02/07/11 1733  WBC 11.4* 13.9*  HGB 14.4 15.0  HCT 44.1 44.6  PLT 317 295   BMET  Basename 02/07/11 1733  NA 136  K 3.6  CL 98  CO2 28  GLUCOSE 107*  BUN 13  CREATININE 1.16  CALCIUM 9.5   LFT  Basename 02/07/11 1733  PROT 7.4  ALBUMIN 3.5  AST 17  ALT 23  ALKPHOS 92  BILITOT 0.3  BILIDIR --  IBILI --   PT/INR  Basename 02/07/11 1733  LABPROT 14.0  INR 1.06   Hepatitis Panel No results found for this basename: HEPBSAG,HCVAB,HEPAIGM,HEPBIGM in the last 72 hours   Studies/Results: No results found.  Assessment: Active Problems:  Abdominal pain, right lower quadrant   Suspected  Crohn's disease of the TI, improved on IV steroids, and antibiotics for suspected inflammatory phlegmon in RLQ  Plan: OK to advance diet, hopefully switch to oral steroids tomorrow, oral Cipro and discharge to follow up with Dr [name]. Will need follow up CT scan   LOS: 2 days   Delfin Edis  02/09/2011, 7:14 AM

## 2011-02-10 DIAGNOSIS — R933 Abnormal findings on diagnostic imaging of other parts of digestive tract: Secondary | ICD-10-CM

## 2011-02-10 MED ORDER — HYDROCODONE-ACETAMINOPHEN 5-325 MG PO TABS: 1.0000 | ORAL_TABLET | Freq: Four times a day (QID) | ORAL | Status: AC | PRN

## 2011-02-10 MED ORDER — PREDNISONE 10 MG PO TABS: 40.0000 mg | ORAL_TABLET | Freq: Every day | ORAL | Status: DC

## 2011-02-10 MED ORDER — AMOXICILLIN-POT CLAVULANATE 875-125 MG PO TABS: 1.0000 | ORAL_TABLET | Freq: Two times a day (BID) | ORAL | Status: AC

## 2011-02-10 MED ORDER — ACETAMINOPHEN 325 MG PO TABS: 650.0000 mg | ORAL_TABLET | Freq: Four times a day (QID) | ORAL | Status: AC | PRN

## 2011-02-10 NOTE — Progress Notes (Signed)
doing well ready for discharge. Meds: Prednisone 50m/day, Augmentin 875 mg po bid, till he sees Dr KDeatra Inain 7-10 days, low residue diet. Pt will need repeat CT scan and colonoscopy at Dr kKelby [facility]

## 2011-02-10 NOTE — Progress Notes (Signed)
DC instructions reviewed with patient.  Patient reported that all information had been discussed with him by Tye Savoy, PA-C and had no further questions or concerns. No changes noted since AM assessment.  IV DC.  Patient verbalized understanding of all home medications.  Patient to be DC to home with his father.  Patient refuses wheelchair and assistance walking downstairs.

## 2011-02-13 LAB — TISSUE TRANSGLUTAMINASE, IGA: Tissue Transglutaminase Ab, IgA: 6 U/mL (ref <20)

## 2011-02-13 LAB — GLIADIN ANTIBODIES, SERUM
Gliadin IgA: 5.9 U/mL (ref <20)
Gliadin IgG: 5.8 U/mL (ref <20)

## 2011-02-14 LAB — RETICULIN ANTIBODIES, IGA W TITER: Reticulin Ab, IgA: NEGATIVE

## 2011-02-15 NOTE — Discharge Summary (Signed)
Physician Discharge Summary  Patient ID: Robert Jenkins MRN: [id number] DOB/AGE: 06-02-1980 30 y.o.  Admit date: 02/07/2011 Discharge date: 02/10/2011 Discharging MD: Delfin Edis, MD Admission Diagnoses: RLQ pain in setting of abnormal CTscan abdomen and pelvis demonstrating a long segment of terminal ileum with circumferential wall thickening.   Discharge Diagnoses:  1. RLQ pain, resolved.  2. Abnormal CTscan abdomen and pelvis demonstrating a long segment of terminal ileum with        circumferential wall thickening. Rule out Crohn's disease.  Discharge Condition: stable Hospital Course:  Robert Jenkins is a 30 year old white male who was referred to [facility] by the ER for evaluation of abdominal pain, leukocytosis and a CT scan showing a long segment of terminal ileum with circumferential wall thickening. Patient saw Dr. [name] in the office on the day of this admission and at that time he reported a one month history of progressive lower abdominal pain, right greater than left. He was admitted from the office for further evaluation and treatment. Upon admission patient was started on IV antibiotics and IV steroids. Within a couple of days he was feeling much better. On 02/10/11, the day of discharge, patient's IV steroids and IV antibiotics were changed to oral. He would stay on Prednisone 31m daily and Augmentin 8730mBID until seen by his primary gastroenterologist, Dr. KaDeatra Inain 7-10 days. Need for colonoscopy and/or repeat CTscan would be decided by Dr. KaDeatra Inat the time of that visit.   Consults: none Procedures: None this admit Disposition: Home or Self Care  Discharge Orders    Future Appointments: Provider: Department: Dept Phone: Center:   02/23/2011 3:00 PM RoInda CastleMD [phone]   Future Orders Please Complete By Expires   Diet low fiber        Discharge Medication List as of 02/10/2011 10:38 AM    START taking these medications     Details  acetaminophen (TYLENOL) 325 MG tablet Take 2 tablets (650 mg total) by mouth every 6 (six) hours as needed (or Fever >/= 101)., Starting 02/10/2011, Until Mon 02/20/11, Normal    amoxicillin-clavulanate (AUGMENTIN) 875-125 MG per tablet Take 1 tablet by mouth 2 (two) times daily., Starting 02/10/2011, Until Mon 02/20/11, Print    HYDROcodone-acetaminophen (NORCO) 5-325 MG per tablet Take 1 tablet by mouth every 6 (six) hours as needed for pain (DO NOT take Tylenol with this medication because it already contains Tylenol)., Starting 02/10/2011, Until Mon 02/20/11, Print    predniSONE (DELTASONE) 10 MG tablet Take 4 tablets (40 mg total) by mouth daily., Starting 02/10/2011, Until Discontinued, Print      STOP taking these medications     oxyCODONE-acetaminophen (PERCOCET) 5-325 MG per tablet        Follow-up Information    Follow up with RoErskine EmeryMD. (Our office will call you Monday with date and time of  appointment.)    Contact information:   [address] [facility]        Signed: PaTye Savoy1/28/2012, 4:32 PM

## 2011-02-23 ENCOUNTER — Ambulatory Visit (INDEPENDENT_AMBULATORY_CARE_PROVIDER_SITE_OTHER): Payer: 59 | Admitting: Gastroenterology

## 2011-02-23 ENCOUNTER — Other Ambulatory Visit (INDEPENDENT_AMBULATORY_CARE_PROVIDER_SITE_OTHER): Payer: 59

## 2011-02-23 ENCOUNTER — Encounter: Payer: Self-pay | Admitting: Gastroenterology

## 2011-02-23 DIAGNOSIS — R1031 Right lower quadrant pain: Secondary | ICD-10-CM

## 2011-02-23 DIAGNOSIS — R9389 Abnormal findings on diagnostic imaging of other specified body structures: Secondary | ICD-10-CM

## 2011-02-23 DIAGNOSIS — R109 Unspecified abdominal pain: Secondary | ICD-10-CM

## 2011-02-23 LAB — CBC WITH DIFFERENTIAL/PLATELET
Eosinophils Relative: 0.9 % (ref 0.0–5.0)
HCT: 46.2 % (ref 39.0–52.0)
Hemoglobin: 15.3 g/dL (ref 13.0–17.0)
Lymphs Abs: 2.7 10*3/uL (ref 0.7–4.0)
Monocytes Relative: 8.7 % (ref 3.0–12.0)
Neutro Abs: 14.7 10*3/uL — ABNORMAL HIGH (ref 1.4–7.7)
Platelets: 268 10*3/uL (ref 150.0–400.0)
RBC: 5.67 Mil/uL (ref 4.22–5.81)
WBC: 19.3 10*3/uL (ref 4.5–10.5)

## 2011-02-23 LAB — COMPREHENSIVE METABOLIC PANEL
CO2: 33 mEq/L — ABNORMAL HIGH (ref 19–32)
GFR: 84.11 mL/min (ref >60.00)
Glucose, Bld: 96 mg/dL (ref 70–99)
Sodium: 139 mEq/L (ref 135–145)
Total Bilirubin: 0.4 mg/dL (ref 0.3–1.2)
Total Protein: 6.8 g/dL (ref 6.0–8.3)

## 2011-02-23 LAB — HIGH SENSITIVITY CRP: CRP, High Sensitivity: 54.58 mg/L — ABNORMAL HIGH (ref 0.000–5.000)

## 2011-02-23 NOTE — Progress Notes (Signed)
History of Present Illness:  Robert Jenkins has returned following hospitalization for an inflammatory mass thought to be Crohn's disease. He was placed on steroids with rapid improvement. Antibiotics were also given. At discharge he was pain-free and having solid bowel movements. He did well for several days. Over the last 3-4 days abdominal pain has increased. He is having loose stools without bleeding. He denies fever or chills. He remains on prednisone 40 mg daily. Appetite is poor.    Review of Systems: Pertinent positive and negative review of systems were noted in the above HPI section. All other review of systems were otherwise negative.    Current Medications, Allergies, Past Medical History, Past Surgical History, Family History and Social History were reviewed in [facility] record  Vital signs were reviewed in today's medical record. Physical Exam: General: Well developed , well nourished, no acute distress On abdominal exam there is moderate tenderness in the right in the right periumbilical area and RLQ. There is rebound. There are no masses organomegaly

## 2011-02-23 NOTE — Patient Instructions (Signed)
  You have been scheduled for a CT scan of the abdomen and pelvis at [facility] ([address]---this is in the same building as [facility]).   You are scheduled on 02/24/2011 at [facility] should arrive 15 minutes prior to your appointment time for registration. Please follow the written instructions below on the day of your exam:  WARNING: IF YOU ARE ALLERGIC TO IODINE/X-RAY DYE, PLEASE NOTIFY RADIOLOGY IMMEDIATELY AT [phone]! YOU WILL BE GIVEN A 13 HOUR PREMEDICATION PREP.  1) Do not eat or drink anything after 5am (4 hours prior to your test) 2) You have been given 2 bottles of oral contrast to drink. The solution may taste               better if refrigerated, but do NOT add ice or any other liquid to this solution. Shake well before drinking.    Drink 1 bottle of contrast @ 7am (2 hours prior to your exam)  Drink 1 bottle of contrast @ 8am (1 hour prior to your exam)  You may take any medications as prescribed with a small amount of water except for the following: Metformin, Glucophage, Glucovance, Avandamet, Riomet, Fortamet, Actoplus Met, Janumet, Glumetza or Metaglip. The above medications must be held the day of the exam AND 48 hours after the exam.  The purpose of you drinking the oral contrast is to aid in the visualization of your intestinal tract. The contrast solution may cause some diarrhea. Before your exam is started, you will be given a small amount of fluid to drink. Depending on your individual set of symptoms, you may also receive an intravenous injection of x-ray contrast/dye. Plan on being at [facility] for 30 minutes or long, depending on the type of exam you are having performed.  If you have any questions regarding your exam or if you need to reschedule, you may call the CT department at [phone] between the hours of 8:00 am and 5:00 pm, Monday-Friday.  GO TO THE LAB TODAY YOU WILL NEED TO COME BACK AND HAVE A PPD TEST ON Monday  MORNING  ________________________________________________________________________

## 2011-02-23 NOTE — Assessment & Plan Note (Addendum)
Symptoms have worsened raising the question of a worsening inflammatory mass or perforation.  Exam is unreliable in the face of high dose steroids.     Recommendations #1 stat CBC and CRP #2 repeat CT of the abdomen and pelvis #3 if CT shows inflammatory changes only  I will add anti-TNF therapy-Humira #4 continue prednisone 40 mg daily #5 low fiber diet #6 PPD

## 2011-02-24 ENCOUNTER — Ambulatory Visit (INDEPENDENT_AMBULATORY_CARE_PROVIDER_SITE_OTHER)
Admission: RE | Admit: 2011-02-24 | Discharge: 2011-02-24 | Disposition: A | Discharge status: Home / Self Care | Payer: 59 | Source: Ambulatory Visit | Attending: Gastroenterology | Admitting: Gastroenterology

## 2011-02-24 ENCOUNTER — Inpatient Hospital Stay (HOSPITAL_COMMUNITY)
Admission: AD | Admit: 2011-02-24 | Discharge: 2011-03-05 | DRG: 331 | Disposition: A | Discharge status: Home / Self Care | Payer: 59 | Source: Ambulatory Visit | Attending: Surgery | Admitting: Surgery

## 2011-02-24 ENCOUNTER — Ambulatory Visit: Payer: 59 | Admitting: Internal Medicine

## 2011-02-24 ENCOUNTER — Encounter (HOSPITAL_COMMUNITY): Payer: Self-pay

## 2011-02-24 DIAGNOSIS — K5090 Crohn's disease, unspecified, without complications: Secondary | ICD-10-CM

## 2011-02-24 DIAGNOSIS — K632 Fistula of intestine: Secondary | ICD-10-CM

## 2011-02-24 DIAGNOSIS — K5000 Crohn's disease of small intestine without complications: Principal | ICD-10-CM | POA: Diagnosis present

## 2011-02-24 DIAGNOSIS — E876 Hypokalemia: Secondary | ICD-10-CM | POA: Diagnosis not present

## 2011-02-24 DIAGNOSIS — R9389 Abnormal findings on diagnostic imaging of other specified body structures: Secondary | ICD-10-CM

## 2011-02-24 DIAGNOSIS — K50013 Crohn's disease of small intestine with fistula: Secondary | ICD-10-CM | POA: Diagnosis present

## 2011-02-24 DIAGNOSIS — R109 Unspecified abdominal pain: Secondary | ICD-10-CM

## 2011-02-24 LAB — CBC
Hemoglobin: 14.6 g/dL (ref 13.0–17.0)
MCHC: 33.3 g/dL (ref 30.0–36.0)
RDW: 13.4 % (ref 11.5–15.5)

## 2011-02-24 LAB — COMPREHENSIVE METABOLIC PANEL
ALT: 65 U/L — ABNORMAL HIGH (ref 0–53)
AST: 26 U/L (ref 0–37)
Albumin: 3.3 g/dL — ABNORMAL LOW (ref 3.5–5.2)
Alkaline Phosphatase: 72 U/L (ref 39–117)
Glucose, Bld: 101 mg/dL — ABNORMAL HIGH (ref 70–99)
Potassium: 3.3 mEq/L — ABNORMAL LOW (ref 3.5–5.1)
Sodium: 138 mEq/L (ref 135–145)
Total Protein: 6.9 g/dL (ref 6.0–8.3)

## 2011-02-24 MED ORDER — PIPERACILLIN-TAZOBACTAM 3.375 G IVPB
3.3750 g | Freq: Three times a day (TID) | INTRAVENOUS | Status: DC
  Administered 2011-02-24 – 2011-02-27 (×10): 3.375 g via INTRAVENOUS
  Filled 2011-02-24 (×10): qty 50

## 2011-02-24 MED ORDER — CHLORHEXIDINE GLUCONATE 0.12 % MT SOLN
15.0000 mL | Freq: Two times a day (BID) | OROMUCOSAL | Status: DC
  Administered 2011-02-24 – 2011-02-26 (×5): 15 mL via OROMUCOSAL
  Filled 2011-02-24 (×7): qty 15

## 2011-02-24 MED ORDER — METHYLPREDNISOLONE SODIUM SUCC 40 MG IJ SOLR
30.0000 mg | INTRAMUSCULAR | Status: DC
  Administered 2011-02-24: 30 mg via INTRAVENOUS
  Filled 2011-02-24 (×2): qty 0.75

## 2011-02-24 MED ORDER — SODIUM CHLORIDE 0.9 % IV SOLN
INTRAVENOUS | Status: DC
  Administered 2011-02-24: 15:00:00 via INTRAVENOUS

## 2011-02-24 MED ORDER — BIOTENE DRY MOUTH MT LIQD
15.0000 mL | Freq: Two times a day (BID) | OROMUCOSAL | Status: DC
  Administered 2011-02-24 – 2011-02-25 (×2): 15 mL via OROMUCOSAL

## 2011-02-24 MED ORDER — ONDANSETRON HCL 4 MG/2ML IJ SOLN
4.0000 mg | Freq: Four times a day (QID) | INTRAMUSCULAR | Status: DC | PRN
  Administered 2011-02-26 – 2011-03-02 (×2): 4 mg via INTRAVENOUS
  Filled 2011-02-24 (×3): qty 2

## 2011-02-24 MED ORDER — HYDROMORPHONE HCL PF 1 MG/ML IJ SOLN
1.0000 mg | INTRAMUSCULAR | Status: DC | PRN
  Administered 2011-02-24 – 2011-02-26 (×6): 1 mg via INTRAVENOUS
  Filled 2011-02-24 (×7): qty 1

## 2011-02-24 MED ORDER — IOHEXOL 300 MG/ML  SOLN
100.0000 mL | Freq: Once | INTRAMUSCULAR | Status: AC | PRN
  Administered 2011-02-24: 100 mL via INTRAVENOUS

## 2011-02-24 NOTE — H&P (Signed)
Primary Care Physician:  Ronald Lobo, MD Primary Gastroenterologist:  Dr.  Laurel Dimmer COMPLAINT:  *abd pain**  HPI: Robert Jenkins is a 30 y.o. male   [facility]   02/23/2011 3:00 PM Office Visit  MRN: [id number]   Description: 30 year old male  Provider: Erskine Emery, MD  Department: [facility]        Diagnoses     Abdominal pain     789.00    Abnormal CT scan     793.99    Abdominal pain, right lower quadrant     789.03      Reason for Visit     Abdominal Pain    RLQ abd pain        Reason For Visit History Recorded        Vitals - Last Recorded       BP Pulse Ht Wt BMI    128/64  58  5' 10"  (1.778 m)  69.4 kg (153 lb)  21.95 kg/m2       Vitals History Recorded       Progress Notes     Erskine Emery, MD  02/24/2011 10:19 AM  Signed History of Present Illness:  Robert Jenkins has returned following hospitalization for an inflammatory mass thought to be Crohn's disease. He was placed on steroids with rapid improvement. Antibiotics were also given. At discharge he was pain-free and having solid bowel movements. He did well for several days. Over the last 3-4 days abdominal pain has increased. He is having loose stools without bleeding. He denies fever or chills. He remains on prednisone 40 mg daily. Appetite is poor.  CT scan today shows and complex inflammatory mass in the RLQ with possible mesenteric-enterofistua, and marked narrowing of the terminal ileum with a thickened wall.          Past Medical History  Diagnosis Date  . Complication of anesthesia     "mental recovery"    Past Surgical History  Procedure Date  . Appendectomy     Prior to Admission medications   Medication Sig Start Date End Date Taking? Authorizing Provider  HYDROcodone-acetaminophen ([place]) 5-325 MG per tablet Takes as directed  02/22/11   Historical Provider, MD  predniSONE (DELTASONE) 10 MG tablet Take 4 tablets (40 mg total) by mouth daily. 02/10/11    Willia Craze, NP    Current Facility-Administered Medications  Medication Dose Route Frequency Provider Last Rate Last Dose  . 0.9 %  sodium chloride infusion   Intravenous Continuous Willia Craze, NP      . HYDROmorphone (DILAUDID) injection 1 mg  1 mg Intravenous Q3H PRN Willia Craze, NP      . piperacillin-tazobactam (ZOSYN) IVPB 3.375 g  3.375 g Intravenous Q8H Willia Craze, NP       Facility-Administered Medications Ordered in Other Encounters  Medication Dose Route Frequency Provider Last Rate Last Dose  . iohexol (OMNIPAQUE) 300 MG/ML solution 100 mL  100 mL Intravenous Once PRN Medication Radiologist   100 mL at 02/24/11 0904    Allergies as of 02/24/2011  . (No Known Allergies)    Family History  Problem Relation Age of Onset  . Skin cancer Mother   . Cancer Mother     melanoma    History   Social History  . Marital Status: Married    Spouse Name: N/A    Number of Children: 0  . Years of Education: N/A   Occupational History  .  [employer] History Main Topics  . Smoking status: Never Smoker   . Smokeless tobacco: Never Used  . Alcohol Use: No  . Drug Use: No  . Sexually Active: Not on file   Other Topics Concern  . Not on file   Social History Narrative  . No narrative on file    Review of Systems: Gen: Denies any fever, chills, sweats, anorexia, fatigue, weakness, malaise, weight loss, and sleep disorder CV: Denies chest pain, angina, palpitations, syncope, orthopnea, PND, peripheral edema, and claudication. Resp: Denies dyspnea at rest, dyspnea with exercise, cough, sputum, wheezing, coughing up blood, and pleurisy. GI: Denies vomiting blood, jaundice, and fecal incontinence.   Denies dysphagia or odynophagia. GU : Denies urinary burning, blood in urine, urinary frequency, urinary hesitancy, nocturnal urination, and urinary incontinence. MS: Denies joint pain, limitation of movement, and swelling, stiffness, low back  pain, extremity pain. Denies muscle weakness, cramps, atrophy.  Derm: Denies rash, itching, dry skin, hives, moles, warts, or unhealing ulcers.  Psych: Denies depression, anxiety, memory loss, suicidal ideation, hallucinations, paranoia, and confusion. Heme: Denies bruising, bleeding, and enlarged lymph nodes. Neuro:  Denies any headaches, dizziness, paresthesias. Endo:  Denies any problems with DM, thyroid, adrenal function.  Physical Exam: Vital signs in last 24 hours: Temp:  [97.9 F (36.6 C)] 97.9 F (36.6 C) (12/07 1408) Pulse Rate:  [58-77] 77  (12/07 1408) Resp:  [22] 22  (12/07 1408) BP: (99-128)/(64-65) 99/65 mmHg (12/07 1408) SpO2:  [99 %] 99 % (12/07 1408) Weight:  [69.4 kg (153 lb)-69.854 kg (154 lb)] 154 lb (69.854 kg) (12/07 1408)   General:   Alert,  Well-developed, well-nourished, pleasant and cooperative in NAD but slightly ill appearing Head:  Normocephalic and atraumatic. Eyes:  Sclera clear, no icterus.   Conjunctiva pink. Ears:  Normal auditory acuity. Nose:  No deformity, discharge,  or lesions. Mouth:  No deformity or lesions.  Oropharynx pink & moist. Neck:  Supple; no masses or thyromegaly. Lungs:  Clear throughout to auscultation.   No wheezes, crackles, or rhonchi. No acute distress. Heart:  Regular rate and rhythm; no murmurs, clicks, rubs,  or gallops. Abdomen:  Soft,nondistended. No masses, hepatosplenomegaly or hernias noted. Normal bowel sounds, without guarding,.  There is marked right periumbilical tenderness .  Rebound is noted in the RLQ Rectal:  Deferred until time of colonoscopy.   Msk:  Symmetrical without gross deformities. Normal posture. Pulses:  Normal pulses noted. Extremities:  Without clubbing or edema. Neurologic:  Alert and  oriented x4;  grossly normal neurologically. Skin:  Intact without significant lesions or rashes. Cervical Nodes:  No significant cervical adenopathy. Psych:  Alert and cooperative. Normal mood and  affect.  Intake/Output from previous day:   Intake/Output this shift:    Lab Results:  Basename 02/23/11 1540  WBC 19.3 Repeated and verified X2.*  HGB 15.3  HCT 46.2  PLT 268.0   BMET  Basename 02/23/11 1540  NA 139  K 3.3*  CL 99  CO2 33*  GLUCOSE 96  BUN 20  CREATININE 1.1  CALCIUM 8.8   LFT  Basename 02/23/11 1540  PROT 6.8  ALBUMIN 3.5  AST 23  ALT 42  ALKPHOS 64  BILITOT 0.4  BILIDIR --  IBILI --   PT/INR No results found for this basename: LABPROT:2,INR:2 in the last 72 hours Hepatitis Panel No results found for this basename: HEPBSAG,HCVAB,HEPAIGM,HEPBIGM in the last 72 hours  Studies/Results: Ct Abdomen Pelvis W Contrast  02/24/2011  *  RADIOLOGY REPORT*  Clinical Data: New diagnosis of Crohn's disease.  Abdominal pain and bloating.  CT ABDOMEN AND PELVIS WITH CONTRAST  Technique:  Multidetector CT imaging of the abdomen and pelvis was performed following the standard protocol during bolus administration of intravenous contrast.  Contrast: 173m OMNIPAQUE IOHEXOL 300 MG/ML IV SOLN  Comparison: CT scan 02/05/2011.  Findings: The lung bases are clear.  The solid abdominal organs are unremarkable and stable. Small lower pole right renal calculi are noted.  The stomach, duodenum and colon are unremarkable and stable. Examination of the small bowel demonstrates persistent fairly marked wall thickening and inflammatory change involving the distal ileum.  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 No discrete drainable abscess.  No free pelvic fluid collections.  The bladder, prostate gland and seminal vesicles are unremarkable.  The aorta and branch vessels are normal and stable.  Stable small scattered mesenteric and retroperitoneal lymph nodes.  The bony structures are intact and stable.  IMPRESSION:  1. Persistent marked  inflammatory change involving the distal ileum consistent with Crohn's disease.  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 2.  Stable scattered mesenteric and retroperitoneal lymph nodes but no adenopathy.  Original Report Authenticated By: P. MKalman Jewels M.D.    Impression / Plan: *Worsening abdominal pain in the face of steroids, prior antibiotic therapy for an inflammatory mass.  I suspect there is localized perforation and worsening inflammation secondary to Crohns Disease that has not responded to medical therapy. I have asked general surgery to see the patient with anticipation that he will require surgical resection.   Restart broad spectrum antibiotics - cipro/flagyl or zosyn Rapid steroid taper*      LOS: 0 days   RErskine Emery 02/24/2011, 2:23 PM

## 2011-02-24 NOTE — Consult Note (Signed)
Robert Jenkins [birth year]  [id number].   Primary Care MD: None Requesting MD: Dr. Melvia Heaps Chief Complaint/Reason for Consult: need for ileocecectomy secondary to presumed crohn's disease HPI: This is a 30 yo white male who had his appendix out 1-2 years ago by Dr. Donell Beers.  He was found to have some thickening of his ileum at that time, but his pathology was negative at that time.  Within the last couple of months, since October, he began having increasing RLQ abdominal pain.  He denies any nausea, vomiting, fevers, or chills.  He has had some diarrhea and about a 15 lb weight loss as eating causes severe pain.  He has had multiple admission by GI for steroids and further treatments.  These seems to help but his pain comes back.  His last CT scan showed significant thickening of the TI with a question of a microperforation and phlegmon.  He has been on steroids and pain medicine at home.  He had a repeat CT scan today which revealed worsening SB inflammation and a enteromesenteric fistula.  We have been asked to evaluate for ileocecectomy.  Review of systems: See HPI, otherwise all other systems have been reviewed and are negative.  Family History  Problem Relation Age of Onset  . Skin cancer Mother   . Cancer Mother     melanoma  NO family history of Crohn's or other GI problems  Past Medical History  Diagnosis Date  . Complication of anesthesia     "mental recovery"    Past Surgical History  Procedure Date  . Appendectomy     Social History:  reports that he has never smoked. He has never used smokeless tobacco. He reports that he does not drink alcohol or use illicit drugs.  Allergies: No Known Allergies  Medications Prior to Admission  Medication Dose Route Frequency Provider Last Rate Last Dose  . 0.9 %  sodium chloride infusion   Intravenous Continuous Robert Pel, Robert Jenkins 100 mL/hr at 02/24/11 1508    . HYDROmorphone (DILAUDID) injection 1 mg  1 mg Intravenous Q3H PRN  Robert Pel, Robert Jenkins   1 mg at 02/24/11 1507  . iohexol (OMNIPAQUE) 300 MG/ML solution 100 mL  100 mL Intravenous Once PRN Medication Radiologist   100 mL at 02/24/11 0904  . piperacillin-tazobactam (ZOSYN) IVPB 3.375 g  3.375 g Intravenous Q8H Robert Pel, Robert Jenkins   3.375 g at 02/24/11 1530   Medications Prior to Admission  Medication Sig Dispense Refill  . HYDROcodone-acetaminophen (NORCO) 5-325 MG per tablet Takes as directed       . predniSONE (DELTASONE) 10 MG tablet Take 4 tablets (40 mg total) by mouth daily.  100 tablet  1    Blood pressure 99/65, pulse 77, temperature 97.9 F (36.6 C), temperature source Oral, resp. rate 22, height 5\' 10"  (1.778 m), weight 154 lb (69.854 kg), SpO2 99.00%. Physical Exam: BP 99/65  Pulse 77  Temp(Src) 97.9 F (36.6 C) (Oral)  Resp 22  Ht 5\' 10"  (1.778 m)  Wt 154 lb (69.854 kg)  BMI 22.10 kg/m2  SpO2 99%  General Appearance:    Alert, cooperative, no distress, appears stated age  Head:    Normocephalic, without obvious abnormality, atraumatic  Eyes:    PERRL, conjunctiva/corneas clear, EOM's intact     Nose:   Nares normal, no drainage or sinus tenderness  Throat:   Lips, mucosa, and tongue normal; teeth and gums normal  Lungs:     Clear to auscultation bilaterally, respirations unlabored. No wheezes, rhonchis, or rales noted     Heart:    Regular rate and rhythm, S1 and S2 normal, no murmur, rub   or gallop  Abdomen:     Soft, tender in the RLQ.  + rebound,bowel sounds active all four quadrants, no masses, no organomegaly, he does have some mild lower abdominal distention.        Extremities:   Extremities normal, atraumatic, no cyanosis or edema  Pulses:   2+ and symmetric all extremities  Skin:   Skin color, texture, turgor normal, no rashes or lesions  Psych:       A&Ox3.  Appropriate affect       Results for orders placed during the hospital encounter of 02/24/11 (from the past 48 hour(s))  CBC     Status: Abnormal    Collection Time   02/24/11  2:34 PM      Component Value Range Comment   WBC 13.6 (*) 4.0 - 10.5 (K/uL)    RBC 5.56  4.22 - 5.81 (MIL/uL)    Hemoglobin 14.6  13.0 - 17.0 (g/dL)    HCT 44.0  10.2 - 72.5 (%)    MCV 78.8  78.0 - 100.0 (fL)    MCH 26.3  26.0 - 34.0 (pg)    MCHC 33.3  30.0 - 36.0 (g/dL)    RDW 36.6  44.0 - 34.7 (%)    Platelets 264  150 - 400 (K/uL)   COMPREHENSIVE METABOLIC PANEL     Status: Abnormal   Collection Time   02/24/11  2:34 PM      Component Value Range Comment   Sodium 138  135 - 145 (mEq/L)    Potassium 3.3 (*) 3.5 - 5.1 (mEq/L)    Chloride 99  96 - 112 (mEq/L)    CO2 31  19 - 32 (mEq/L)    Glucose, Bld 101 (*) 70 - 99 (mg/dL)    BUN 17  6 - 23 (mg/dL)    Creatinine, Ser 4.25  0.50 - 1.35 (mg/dL)    Calcium 9.3  8.4 - 10.5 (mg/dL)    Total Protein 6.9  6.0 - 8.3 (g/dL)    Albumin 3.3 (*) 3.5 - 5.2 (g/dL)    AST 26  0 - 37 (U/L)    ALT 65 (*) 0 - 53 (U/L)    Alkaline Phosphatase 72  39 - 117 (U/L)    Total Bilirubin 0.2 (*) 0.3 - 1.2 (mg/dL)    GFR calc non Af Amer >90  >90 (mL/min)    GFR calc Af Amer >90  >90 (mL/min)   SAMPLE TO BLOOD BANK     Status: Normal   Collection Time   02/24/11  3:30 PM      Component Value Range Comment   Blood Bank Specimen SAMPLE AVAILABLE FOR TESTING      Sample Expiration 02/27/2011      Ct Abdomen Pelvis W Contrast  02/24/2011  *RADIOLOGY REPORT*  Clinical Data: New diagnosis of Crohn's disease.  Abdominal pain and bloating.  CT ABDOMEN AND PELVIS WITH CONTRAST  Technique:  Multidetector CT imaging of the abdomen and pelvis was performed following the standard protocol during bolus administration of intravenous contrast.  Contrast: OMNIPAQUE IOHEXOL 300 MG/ML IV SOLN  Comparison: CT scan 02/05/2011.  Findings: The lung bases are clear.  The solid abdominal organs are unremarkable and stable. Small lower pole right renal calculi are  noted.  The stomach, duodenum and colon are unremarkable and stable. Examination of  the small bowel demonstrates persistent fairly marked wall thickening and inflammatory change involving the distal ileum.  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 No discrete drainable abscess.  No free pelvic fluid collections.  The bladder, prostate gland and seminal vesicles are unremarkable.  The aorta and branch vessels are normal and stable.  Stable small scattered mesenteric and retroperitoneal lymph nodes.  The bony structures are intact and stable.  IMPRESSION:  1. Persistent marked inflammatory change involving the distal ileum consistent with Crohn's disease.  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 2.  Stable scattered mesenteric and retroperitoneal lymph nodes but no adenopathy.  Original Report Authenticated By: P. Loralie Champagne, M.D.       Assessment/Plan 1. Suspected crohn's disease 2. RLQ abdominal secondary to #1  Plan: Agree with abx therapy.  Continue NPO status for possible fistula.  Will d/w Dr. Johna Sheriff to see if he can do the operation, ileocecectomy, tomorrow.  If not then Dr. Donell Beers will see if she is able to proceed this coming Wednesday.  We discussed with the patient a possible laparoscopic resection, but the possible need for an open resection.  We will follow with you.  Teshaun Olarte E 02/24/2011, 3:45 PM

## 2011-02-24 NOTE — Plan of Care (Signed)
Problem: Diagnosis - Type of Surgery Goal: General Surgical Patient Education (See Patient Education module for education specifics) Outcome: Progressing PA educated patient about likely Crohn's diagnosis, and this nurse gave [place] handout on Crohn's. Brandon Melnick. RN, BSN 02/24/2011 8:20 PM

## 2011-02-24 NOTE — Progress Notes (Signed)
Quick Note:  He has a persistent inflammatory mass. I suspect is a perforation.  Patient needs admission to was too long and surgical consultation. ______

## 2011-02-25 DIAGNOSIS — K5090 Crohn's disease, unspecified, without complications: Secondary | ICD-10-CM

## 2011-02-25 LAB — COMPREHENSIVE METABOLIC PANEL
CO2: 31 mEq/L (ref 19–32)
Calcium: 9.5 mg/dL (ref 8.4–10.5)
Creatinine, Ser: 1.14 mg/dL (ref 0.50–1.35)
GFR calc Af Amer: >90 mL/min (ref >90)
GFR calc non Af Amer: 85 mL/min — ABNORMAL LOW (ref >90)
Glucose, Bld: 133 mg/dL — ABNORMAL HIGH (ref 70–99)
Total Bilirubin: 0.7 mg/dL (ref 0.3–1.2)

## 2011-02-25 LAB — CBC
HCT: 43.7 % (ref 39.0–52.0)
Hemoglobin: 14.5 g/dL (ref 13.0–17.0)
MCH: 26.4 pg (ref 26.0–34.0)
MCV: 79.6 fL (ref 78.0–100.0)
RBC: 5.49 MIL/uL (ref 4.22–5.81)

## 2011-02-25 MED ORDER — ERYTHROMYCIN BASE 250 MG PO TABS
500.0000 mg | ORAL_TABLET | Freq: Three times a day (TID) | ORAL | Status: AC
  Administered 2011-02-26 (×3): 500 mg via ORAL
  Filled 2011-02-25 (×3): qty 2

## 2011-02-25 MED ORDER — NEOMYCIN SULFATE 500 MG PO TABS
1000.0000 mg | ORAL_TABLET | Freq: Three times a day (TID) | ORAL | Status: AC
  Administered 2011-02-26 (×3): 1000 mg via ORAL
  Filled 2011-02-25 (×3): qty 2

## 2011-02-25 MED ORDER — PREDNISONE 20 MG PO TABS
20.0000 mg | ORAL_TABLET | Freq: Every day | ORAL | Status: DC
  Administered 2011-02-25 – 2011-02-27 (×3): 20 mg via ORAL
  Filled 2011-02-25 (×3): qty 1

## 2011-02-25 NOTE — Progress Notes (Signed)
Patient ID: Robert Jenkins, male   DOB: 1980/08/16, 30 y.o.   MRN: [id number]  General Surgery - [facility] - Progress Note  Subjective: Pt showered and ambulating.  Father at bedside.  No complaints of pain or nausea.  Wants to go home.  Objective: Vital signs in last 24 hours: Temp:  [97.4 F (36.3 C)-97.9 F (36.6 C)] 97.9 F (36.6 C) (12/08 0615) Pulse Rate:  [61-77] 61  (12/08 0615) Resp:  [16-22] 16  (12/08 0615) BP: (96-112)/(61-72) 96/61 mmHg (12/08 0615) SpO2:  [96 %-99 %] 98 % (12/08 0615) Weight:  [154 lb (69.854 kg)] 154 lb (69.854 kg) (12/07 1408) Last BM Date: 02/24/11  Intake/Output from previous day:    Exam: HEENT - normal Chest - clear Cor - RRR Abd - soft, scaphoid, non-tender; well-healed laparoscopic wounds; no hernia Ext - no edema Neuro - grossly intact  Lab Results:   Basename 02/25/11 0325 02/24/11 1434  WBC 9.7 13.6*  HGB 14.5 14.6  HCT 43.7 43.8  PLT 251 264     Basename 02/25/11 0325 02/24/11 1434  NA 134* 138  K 4.6 3.3*  CL 97 99  CO2 31 31  GLUCOSE 133* 101*  BUN 13 17  CREATININE 1.14 1.00  CALCIUM 9.5 9.3    Studies/Results: Ct Abdomen Pelvis W Contrast  02/24/2011  *RADIOLOGY REPORT*  Clinical Data: New diagnosis of Crohn's disease.  Abdominal pain and bloating.  CT ABDOMEN AND PELVIS WITH CONTRAST  Technique:  Multidetector CT imaging of the abdomen and pelvis was performed following the standard protocol during bolus administration of intravenous contrast.  Contrast: OMNIPAQUE IOHEXOL 300 MG/ML IV SOLN  Comparison: CT scan 02/05/2011.  Findings: The lung bases are clear.  The solid abdominal organs are unremarkable and stable. Small lower pole right renal calculi are noted.  The stomach, duodenum and colon are unremarkable and stable. Examination of the small bowel demonstrates persistent fairly marked wall thickening and inflammatory change involving the distal ileum.  There is an enteromesenteric  fistula with a persistent inflammatory mass in the adjacent small bowel mesentery with enlarged lymph nodes and a small amount of leaking oral contrast. No discrete drainable abscess.  No free pelvic fluid collections.  The bladder, prostate gland and seminal vesicles are unremarkable.  The aorta and branch vessels are normal and stable.  Stable small scattered mesenteric and retroperitoneal lymph nodes.  The bony structures are intact and stable.  IMPRESSION:  1. Persistent marked inflammatory change involving the distal ileum consistent with Crohn's disease.  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 2.  Stable scattered mesenteric and retroperitoneal lymph nodes but no adenopathy.  Original Report Authenticated By: P. Loralie Champagne, M.D.    Anti-infectives     Start     Dose/Rate Route Frequency Ordered Stop   02/24/11 1500  piperacillin-tazobactam (ZOSYN) IVPB 3.375 g       3.375 g 12.5 mL/hr over 240 Minutes Intravenous Every 8 hours 02/24/11 1414            Assessment: Complicated Crohn's disease with terminal ileitis and fistulae  Plan: Continue IV steroids Continue IV antibiotics Antibiotic bowel prep tomorrow Clear liquid diet today and tomorrow Will schedule for OR on Monday, Dec 10th with Dr. Consuello Bossier as surgeon Discussed with patient and his father who agree to proceed Orders written   Velora Heckler, MD, FACS General & Endocrine Surgery [facility]  02/25/2011

## 2011-02-25 NOTE — Progress Notes (Signed)
[facility] Progress Note  Subjective: Feels well.  Hungry and tolerating clears. No BM since yesterday. No fevers, not needs pain meds at this time.  Objective:  Vital signs in last 24 hours: Temp:  [97.4 F (36.3 C)-97.9 F (36.6 C)] 97.9 F (36.6 C) (12/08 0615) Pulse Rate:  [61-77] 61  (12/08 0615) Resp:  [16-22] 16  (12/08 0615) BP: (96-112)/(61-72) 96/61 mmHg (12/08 0615) SpO2:  [96 %-99 %] 98 % (12/08 0615) Weight:  [154 lb (69.854 kg)] 154 lb (69.854 kg) (12/07 1408) Last BM Date: 02/24/11 General:   Alert,  Well-developed,  NAD Heart:  Regular rate and rhythm; no murmurs Abdomen:  Soft, nontender and nondistended. Normal bowel sounds, without guarding, and without rebound.   Extremities:  Without edema. Neurologic:  Alert and  oriented x4;  grossly normal neurologically. Psych:  Alert and cooperative. Normal mood and affect.   Lab Results:  Basename 02/25/11 0325 02/24/11 1434 02/23/11 1540  WBC 9.7 13.6* 19.3 Repeated and verified X2.*  HGB 14.5 14.6 15.3  HCT 43.7 43.8 46.2  PLT 251 264 268.0   BMET  Basename 02/25/11 0325 02/24/11 1434 02/23/11 1540  NA 134* 138 139  K 4.6 3.3* 3.3*  CL 97 99 99  CO2 31 31 33*  GLUCOSE 133* 101* 96  BUN 13 17 20   CREATININE 1.14 1.00 1.1  CALCIUM 9.5 9.3 8.8   LFT  Basename 02/25/11 0325  PROT 6.7  ALBUMIN 3.1*  AST 44*  ALT 94*  ALKPHOS 72  BILITOT 0.7  BILIDIR --  IBILI --   Studies/Results: Ct Abdomen Pelvis W Contrast  02/24/2011  *RADIOLOGY REPORT*  Clinical Data: New diagnosis of Crohn's disease.  Abdominal pain and bloating.  CT ABDOMEN AND PELVIS WITH CONTRAST  Technique:  Multidetector CT imaging of the abdomen and pelvis was performed following the standard protocol during bolus administration of intravenous contrast.  Contrast: 165m OMNIPAQUE IOHEXOL 300 MG/ML IV SOLN  Comparison: CT scan 02/05/2011.  Findings: The lung bases are clear.  The solid abdominal organs are unremarkable and  stable. Small lower pole right renal calculi are noted.  The stomach, duodenum and colon are unremarkable and stable. Examination of the small bowel demonstrates persistent fairly marked wall thickening and inflammatory change involving the distal ileum.  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 No discrete drainable abscess.  No free pelvic fluid collections.  The bladder, prostate gland and seminal vesicles are unremarkable.  The aorta and branch vessels are normal and stable.  Stable small scattered mesenteric and retroperitoneal lymph nodes.  The bony structures are intact and stable.  IMPRESSION:  1. Persistent marked inflammatory change involving the distal ileum consistent with Crohn's disease.  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 2.  Stable scattered mesenteric and retroperitoneal lymph nodes but no adenopathy.  Original Report Authenticated By: P. MKalman Jewels M.D.     Assessment / Plan: 30yo with ileal Crohn's complicated by fistulae  1. Crohn's -- looks remarkable well given imaging results.  On IV abx, WBC count improved. Tolerating full liquids.  Will leave on clears for now. Cont IV zosyn. Will reduce steroid dose as at this point, pending surgery, they are unlikely to provide additional benefit.  Will not d/c completely given risk of adrenal insufficiency. Appreciate surgical assistance Plan for  OR Monday.    LOS: 1 day   Bibi Economos M  02/25/2011, 11:56 AM

## 2011-02-26 DIAGNOSIS — K5090 Crohn's disease, unspecified, without complications: Secondary | ICD-10-CM

## 2011-02-26 LAB — CBC
HCT: 46.4 % (ref 39.0–52.0)
MCV: 79.7 fL (ref 78.0–100.0)
Platelets: 289 10*3/uL (ref 150–400)
RBC: 5.82 MIL/uL — ABNORMAL HIGH (ref 4.22–5.81)
RDW: 13.7 % (ref 11.5–15.5)
WBC: 10.2 10*3/uL (ref 4.0–10.5)

## 2011-02-26 LAB — BASIC METABOLIC PANEL
CO2: 32 mEq/L (ref 19–32)
Calcium: 9.7 mg/dL (ref 8.4–10.5)
Chloride: 98 mEq/L (ref 96–112)
GFR calc non Af Amer: 86 mL/min — ABNORMAL LOW (ref >90)
Potassium: 3.7 mEq/L (ref 3.5–5.1)

## 2011-02-26 MED ORDER — ALVIMOPAN 12 MG PO CAPS
12.0000 mg | ORAL_CAPSULE | Freq: Once | ORAL | Status: AC
  Administered 2011-02-27: 12 mg via ORAL
  Filled 2011-02-26: qty 1

## 2011-02-26 MED ORDER — ACETAMINOPHEN 325 MG PO TABS: 650.0000 mg | ORAL_TABLET | ORAL | Status: DC | PRN

## 2011-02-26 MED ORDER — ACETAMINOPHEN 325 MG PO TABS
ORAL_TABLET | ORAL | Status: AC
  Filled 2011-02-26: qty 2

## 2011-02-26 MED ORDER — HYDROCODONE-ACETAMINOPHEN 5-325 MG PO TABS
1.0000 | ORAL_TABLET | ORAL | Status: DC | PRN
  Administered 2011-02-26: 1 via ORAL
  Filled 2011-02-26: qty 2

## 2011-02-26 MED ORDER — DEXTROSE-NACL 5-0.45 % IV SOLN
INTRAVENOUS | Status: DC
  Administered 2011-02-26 – 2011-02-27 (×2): via INTRAVENOUS

## 2011-02-26 NOTE — Progress Notes (Signed)
[facility] Progress Note  Subjective: Feels about the same.  Some mild nausea.  Trying clears, but not appetizing.  Ready for OR tomorrow.  Objective:  Vital signs in last 24 hours: Temp:  [97.5 F (36.4 C)-98 F (36.7 C)] 97.5 F (36.4 C) (12/09 0550) Pulse Rate:  [58-77] 58  (12/09 0550) Resp:  [16-18] 18  (12/09 0550) BP: (100-128)/(51-74) 101/51 mmHg (12/09 0550) SpO2:  [95 %-100 %] 99 % (12/09 0550) Last BM Date: 02/24/11 General:   Alert,  Well-developed,  Male, NAD Heart:  Regular rate and rhythm; no murmurs Chest:  CTA b/l Abdomen:  Soft, mild TTP RLQ, nondistended. Normal bowel sounds, without guarding, and without rebound.   Extremities:  Without edema. Neurologic:  Alert and  oriented x4;  grossly normal neurologically. Psych:  Alert and cooperative. Normal mood and affect.  Intake/Output from previous day: 12/08 0701 - 12/09 0700 In: 480 [P.O.:480] Out: -  Intake/Output this shift: Total I/O In: 480 [P.O.:480] Out: -   Lab Results:  Basename 02/26/11 0420 02/25/11 0325 02/24/11 1434  WBC 10.2 9.7 13.6*  HGB 15.5 14.5 14.6  HCT 46.4 43.7 43.8  PLT 289 251 264   BMET  Basename 02/26/11 0420 02/25/11 0325 02/24/11 1434  NA 137 134* 138  K 3.7 4.6 3.3*  CL 98 97 99  CO2 32 31 31  GLUCOSE 105* 133* 101*  BUN 12 13 17   CREATININE 1.13 1.14 1.00  CALCIUM 9.7 9.5 9.3   LFT  Basename 02/25/11 0325  PROT 6.7  ALBUMIN 3.1*  AST 44*  ALT 94*  ALKPHOS 72  BILITOT 0.7  BILIDIR --  IBILI --    Assessment / Plan: 30 yo with ileal Crohn's complicated by fistulae   1. Crohn's -- looks remarkable well given imaging results. Continues on pip/tazo  Clears, NPO after MN Plan for OR Monday.   LOS: 2 days   Shaleta Ruacho M  02/26/2011, 1:18 PM

## 2011-02-26 NOTE — Progress Notes (Signed)
Patient ID: Robert Jenkins, male   DOB: 07/21/1980, 30 y.o.   MRN: [id number]  [facility] - Progress Note  HD# 3  Subjective: Pt without complaint.  No nausea.  Minimal pain.  Ready for surgery.  Objective: Vital signs in last 24 hours: Temp:  [97.5 F (36.4 C)-98 F (36.7 C)] 97.5 F (36.4 C) (12/09 0550) Pulse Rate:  [58-77] 58  (12/09 0550) Resp:  [16-18] 18  (12/09 0550) BP: (100-128)/(51-74) 101/51 mmHg (12/09 0550) SpO2:  [95 %-100 %] 99 % (12/09 0550) Last BM Date: 02/24/11  Intake/Output from previous day: 12/08 0701 - 12/09 0700 In: 480 [P.O.:480] Out: -   Exam: HEENT - clear, not icteric Neck - soft without mass Chest - clear bilaterally Cor - RRR, no murmur Abd - soft without distension; mild RLQ tenderness; no mass; BS present; BM last night Ext - no significant edema Neuro - grossly intact, no focal deficits  Lab Results:   [facility] 02/26/11 0420 02/25/11 0325  WBC 10.2 9.7  HGB 15.5 14.5  HCT 46.4 43.7  PLT 289 251     Basename 02/26/11 0420 02/25/11 0325  NA 137 134*  K 3.7 4.6  CL 98 97  CO2 32 31  GLUCOSE 105* 133*  BUN 12 13  CREATININE 1.13 1.14  CALCIUM 9.7 9.5    Studies/Results: Ct Abdomen Pelvis W Contrast  02/24/2011  *RADIOLOGY REPORT*  Clinical Data: New diagnosis of Crohn's disease.  Abdominal pain and bloating.  CT ABDOMEN AND PELVIS WITH CONTRAST  Technique:  Multidetector CT imaging of the abdomen and pelvis was performed following the standard protocol during bolus administration of intravenous contrast.  Contrast: 118m OMNIPAQUE IOHEXOL 300 MG/ML IV SOLN  Comparison: CT scan 02/05/2011.  Findings: The lung bases are clear.  The solid abdominal organs are unremarkable and stable. Small lower pole right renal calculi are noted.  The stomach, duodenum and colon are unremarkable and stable. Examination of the small bowel demonstrates persistent fairly marked wall thickening and inflammatory  change involving the distal ileum.  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 No discrete drainable abscess.  No free pelvic fluid collections.  The bladder, prostate gland and seminal vesicles are unremarkable.  The aorta and branch vessels are normal and stable.  Stable small scattered mesenteric and retroperitoneal lymph nodes.  The bony structures are intact and stable.  IMPRESSION:  1. Persistent marked inflammatory change involving the distal ileum consistent with Crohn's disease.  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 2.  Stable scattered mesenteric and retroperitoneal lymph nodes but no adenopathy.  Original Report Authenticated By: P. MKalman Jewels M.D.    Assessment: Complicated Crohn's disease - for ileocecal resection in AM 12/10  Plan: IV Zosyn to continue to surgery Bowel prep today - on clear liquids Orders written  The risks and benefits of the procedure have been discussed at length with the patient.  The patient understands the proposed procedure, potential alternative treatments, and the course of recovery to be expected.  All of the patient's questions have been answered at this time.  The patient wishes to proceed with surgery.   TEarnstine Regal MD, [facility]  02/26/2011

## 2011-02-27 ENCOUNTER — Inpatient Hospital Stay (HOSPITAL_COMMUNITY): Payer: 59 | Admitting: Anesthesiology

## 2011-02-27 ENCOUNTER — Encounter (HOSPITAL_COMMUNITY)
Admission: AD | Disposition: A | Discharge status: Home / Self Care | Payer: Self-pay | Source: Ambulatory Visit | Attending: Internal Medicine

## 2011-02-27 ENCOUNTER — Encounter (HOSPITAL_COMMUNITY): Payer: Self-pay | Admitting: Anesthesiology

## 2011-02-27 ENCOUNTER — Other Ambulatory Visit (INDEPENDENT_AMBULATORY_CARE_PROVIDER_SITE_OTHER): Payer: Self-pay | Admitting: General Surgery

## 2011-02-27 HISTORY — PX: LAPAROTOMY: SHX154

## 2011-02-27 LAB — CBC
HCT: 45 % (ref 39.0–52.0)
MCH: 26.1 pg (ref 26.0–34.0)
MCHC: 32.9 g/dL (ref 30.0–36.0)
MCV: 79.2 fL (ref 78.0–100.0)
RDW: 13.5 % (ref 11.5–15.5)

## 2011-02-27 LAB — BASIC METABOLIC PANEL
BUN: 12 mg/dL (ref 6–23)
Calcium: 9.1 mg/dL (ref 8.4–10.5)
Creatinine, Ser: 1.29 mg/dL (ref 0.50–1.35)
GFR calc Af Amer: 85 mL/min — ABNORMAL LOW (ref >90)
GFR calc non Af Amer: 73 mL/min — ABNORMAL LOW (ref >90)

## 2011-02-27 LAB — PROTIME-INR: INR: 1.01 (ref 0.00–1.49)

## 2011-02-27 SURGERY — LAPAROTOMY, EXPLORATORY
Anesthesia: General | Site: Abdomen | Wound class: Clean Contaminated

## 2011-02-27 MED ORDER — SODIUM CHLORIDE 0.9 % IR SOLN
Status: DC | PRN
  Administered 2011-02-27: 3000 mL

## 2011-02-27 MED ORDER — MIDAZOLAM HCL 5 MG/5ML IJ SOLN
INTRAMUSCULAR | Status: DC | PRN
  Administered 2011-02-27: 2 mg via INTRAVENOUS

## 2011-02-27 MED ORDER — ONDANSETRON HCL 4 MG/2ML IJ SOLN: 4.0000 mg | Freq: Four times a day (QID) | INTRAMUSCULAR | Status: DC | PRN

## 2011-02-27 MED ORDER — MIDAZOLAM HCL 5 MG/ML IJ SOLN
1.0000 mg | INTRAMUSCULAR | Status: DC
  Administered 2011-02-27: 1 mg via INTRAVENOUS
  Administered 2011-02-27: 0.5 mg via INTRAVENOUS
  Administered 2011-02-27: 1 mg via INTRAVENOUS

## 2011-02-27 MED ORDER — KCL IN DEXTROSE-NACL 20-5-0.45 MEQ/L-%-% IV SOLN
INTRAVENOUS | Status: DC
  Administered 2011-02-27 – 2011-03-04 (×10): via INTRAVENOUS
  Filled 2011-02-27 (×19): qty 1000

## 2011-02-27 MED ORDER — HYDROCORTISONE SOD SUCCINATE 100 MG IJ SOLR
INTRAMUSCULAR | Status: AC
  Filled 2011-02-27: qty 2

## 2011-02-27 MED ORDER — POVIDONE-IODINE 10 % EX OINT
TOPICAL_OINTMENT | CUTANEOUS | Status: DC | PRN
  Administered 2011-02-27: 1 via TOPICAL

## 2011-02-27 MED ORDER — POVIDONE-IODINE 10 % EX OINT
TOPICAL_OINTMENT | CUTANEOUS | Status: AC
  Filled 2011-02-27: qty 28.35

## 2011-02-27 MED ORDER — HYDROMORPHONE HCL PF 1 MG/ML IJ SOLN
INTRAMUSCULAR | Status: AC
  Filled 2011-02-27: qty 1

## 2011-02-27 MED ORDER — DIPHENHYDRAMINE HCL 12.5 MG/5ML PO ELIX: 12.5000 mg | ORAL_SOLUTION | Freq: Four times a day (QID) | ORAL | Status: DC | PRN

## 2011-02-27 MED ORDER — ALVIMOPAN 12 MG PO CAPS
12.0000 mg | ORAL_CAPSULE | Freq: Two times a day (BID) | ORAL | Status: DC
  Administered 2011-02-28 – 2011-03-02 (×6): 12 mg via ORAL
  Filled 2011-02-27 (×8): qty 1

## 2011-02-27 MED ORDER — LACTATED RINGERS IV SOLN
INTRAVENOUS | Status: DC | PRN
  Administered 2011-02-27 (×2): via INTRAVENOUS

## 2011-02-27 MED ORDER — HYDROCORTISONE SOD SUCCINATE 100 MG IJ SOLR
50.0000 mg | Freq: Two times a day (BID) | INTRAMUSCULAR | Status: AC
  Administered 2011-02-27: 50 mg via INTRAVENOUS
  Administered 2011-02-28: 100 mg via INTRAVENOUS
  Administered 2011-02-28 – 2011-03-01 (×2): 50 mg via INTRAVENOUS
  Filled 2011-02-27 (×5): qty 1

## 2011-02-27 MED ORDER — LACTATED RINGERS IV SOLN: INTRAVENOUS | Status: DC

## 2011-02-27 MED ORDER — GLYCOPYRROLATE 0.2 MG/ML IJ SOLN
INTRAMUSCULAR | Status: DC | PRN
  Administered 2011-02-27: .5 mg via INTRAVENOUS

## 2011-02-27 MED ORDER — PIPERACILLIN-TAZOBACTAM 3.375 G IVPB
3.3750 g | Freq: Three times a day (TID) | INTRAVENOUS | Status: AC
  Administered 2011-02-27 – 2011-03-01 (×6): 3.375 g via INTRAVENOUS
  Filled 2011-02-27 (×6): qty 50

## 2011-02-27 MED ORDER — HYDROMORPHONE HCL PF 1 MG/ML IJ SOLN
INTRAMUSCULAR | Status: DC | PRN
  Administered 2011-02-27 (×4): 0.5 mg via INTRAVENOUS

## 2011-02-27 MED ORDER — PROMETHAZINE HCL 25 MG/ML IJ SOLN: 6.2500 mg | INTRAMUSCULAR | Status: DC | PRN

## 2011-02-27 MED ORDER — MIDAZOLAM HCL 5 MG/ML IJ SOLN
INTRAMUSCULAR | Status: AC
  Filled 2011-02-27: qty 1

## 2011-02-27 MED ORDER — DIPHENHYDRAMINE HCL 50 MG/ML IJ SOLN
12.5000 mg | Freq: Four times a day (QID) | INTRAMUSCULAR | Status: DC | PRN
  Administered 2011-02-28 (×2): 12.5 mg via INTRAVENOUS
  Administered 2011-03-01: 15:00:00 via INTRAVENOUS
  Administered 2011-03-01 (×2): 12.5 mg via INTRAVENOUS
  Filled 2011-02-27 (×4): qty 1

## 2011-02-27 MED ORDER — LIDOCAINE HCL (CARDIAC) 20 MG/ML IV SOLN
INTRAVENOUS | Status: DC | PRN
  Administered 2011-02-27: 75 mg via INTRAVENOUS

## 2011-02-27 MED ORDER — HYDROMORPHONE HCL PF 1 MG/ML IJ SOLN
0.2500 mg | INTRAMUSCULAR | Status: AC | PRN
  Administered 2011-02-27 (×8): 0.25 mg via INTRAVENOUS

## 2011-02-27 MED ORDER — ACETAMINOPHEN 10 MG/ML IV SOLN
INTRAVENOUS | Status: DC | PRN
  Administered 2011-02-27: 1000 mg via INTRAVENOUS

## 2011-02-27 MED ORDER — NALOXONE HCL 0.4 MG/ML IJ SOLN: 0.4000 mg | INTRAMUSCULAR | Status: DC | PRN

## 2011-02-27 MED ORDER — NEOSTIGMINE METHYLSULFATE 1 MG/ML IJ SOLN
INTRAMUSCULAR | Status: DC | PRN
  Administered 2011-02-27: 4 mg via INTRAVENOUS

## 2011-02-27 MED ORDER — PROPOFOL 10 MG/ML IV BOLUS
INTRAVENOUS | Status: DC | PRN
  Administered 2011-02-27: 200 mg via INTRAVENOUS

## 2011-02-27 MED ORDER — HYDROMORPHONE 0.3 MG/ML IV SOLN
INTRAVENOUS | Status: DC
  Administered 2011-02-27: 0.3 mg via INTRAVENOUS
  Administered 2011-02-28: 15:00:00 via INTRAVENOUS
  Administered 2011-02-28: 5.4 mg via INTRAVENOUS
  Administered 2011-02-28: 3.7 mg via INTRAVENOUS
  Administered 2011-02-28: 3 mg via INTRAVENOUS
  Administered 2011-02-28: 02:00:00 via INTRAVENOUS
  Filled 2011-02-27 (×5): qty 25

## 2011-02-27 MED ORDER — SODIUM CHLORIDE 0.9 % IJ SOLN: 9.0000 mL | INTRAMUSCULAR | Status: DC | PRN

## 2011-02-27 MED ORDER — SUCCINYLCHOLINE CHLORIDE 20 MG/ML IJ SOLN
INTRAMUSCULAR | Status: DC | PRN
  Administered 2011-02-27: 100 mg via INTRAVENOUS

## 2011-02-27 MED ORDER — POTASSIUM CHLORIDE 10 MEQ/100ML IV SOLN
10.0000 meq | INTRAVENOUS | Status: DC
  Administered 2011-02-27 (×4): 10 meq via INTRAVENOUS
  Filled 2011-02-27 (×5): qty 100

## 2011-02-27 MED ORDER — HYDROMORPHONE HCL PF 1 MG/ML IJ SOLN
0.2500 mg | INTRAMUSCULAR | Status: DC | PRN
  Administered 2011-02-27 (×4): 0.5 mg via INTRAVENOUS

## 2011-02-27 MED ORDER — HYDROCORTISONE SOD SUCCINATE 100 MG IJ SOLR
INTRAMUSCULAR | Status: DC | PRN
  Administered 2011-02-27: 100 mg via INTRAVENOUS

## 2011-02-27 MED ORDER — LACTATED RINGERS IV SOLN: INTRAVENOUS | Status: DC | PRN

## 2011-02-27 MED ORDER — LACTATED RINGERS IV SOLN
INTRAVENOUS | Status: DC | PRN
  Administered 2011-02-27: 10:00:00 via INTRAVENOUS

## 2011-02-27 MED ORDER — CISATRACURIUM BESYLATE 2 MG/ML IV SOLN
INTRAVENOUS | Status: DC | PRN
  Administered 2011-02-27: 2 mg via INTRAVENOUS
  Administered 2011-02-27: 4 mg via INTRAVENOUS
  Administered 2011-02-27: 1 mg via INTRAVENOUS
  Administered 2011-02-27: 8 mg via INTRAVENOUS

## 2011-02-27 MED ORDER — SUFENTANIL CITRATE 50 MCG/ML IV SOLN
INTRAVENOUS | Status: DC | PRN
  Administered 2011-02-27 (×2): 10 ug via INTRAVENOUS
  Administered 2011-02-27: 5 ug via INTRAVENOUS
  Administered 2011-02-27: 10 ug via INTRAVENOUS
  Administered 2011-02-27: 15 ug via INTRAVENOUS

## 2011-02-27 MED ORDER — ACETAMINOPHEN 10 MG/ML IV SOLN
INTRAVENOUS | Status: AC
  Filled 2011-02-27: qty 100

## 2011-02-27 MED ORDER — ONDANSETRON HCL 4 MG/2ML IJ SOLN
INTRAMUSCULAR | Status: DC | PRN
  Administered 2011-02-27: 4 mg via INTRAVENOUS

## 2011-02-27 SURGICAL SUPPLY — 48 items
APPLICATOR COTTON TIP 6IN STRL (MISCELLANEOUS) ×2 IMPLANT
BLADE EXTENDED COATED 6.5IN (ELECTRODE) ×1 IMPLANT
BLADE HEX COATED 2.75 (ELECTRODE) ×2 IMPLANT
CANISTER SUCTION 2500CC (MISCELLANEOUS) ×2 IMPLANT
CLOTH BEACON ORANGE TIMEOUT ST (SAFETY) ×2 IMPLANT
COVER MAYO STAND STRL (DRAPES) ×2 IMPLANT
DRAPE LAPAROSCOPIC ABDOMINAL (DRAPES) ×2 IMPLANT
DRAPE WARM FLUID 44X44 (DRAPE) ×1 IMPLANT
ELECT REM PT RETURN 9FT ADLT (ELECTROSURGICAL) ×2
ELECTRODE REM PT RTRN 9FT ADLT (ELECTROSURGICAL) ×1 IMPLANT
GLOVE BIO SURGEON STRL SZ7 (GLOVE) ×1 IMPLANT
GLOVE BIO SURGEON STRL SZ7.5 (GLOVE) ×2 IMPLANT
GLOVE BIOGEL PI IND STRL 6.5 (GLOVE) ×1 IMPLANT
GLOVE BIOGEL PI IND STRL 7.0 (GLOVE) ×1 IMPLANT
GLOVE BIOGEL PI INDICATOR 6.5 (GLOVE) ×1
GLOVE BIOGEL PI INDICATOR 7.0 (GLOVE) ×2
GLOVE ECLIPSE 7.0 STRL STRAW (GLOVE) ×1 IMPLANT
GLOVE ORTHO TXT STRL SZ7.5 (GLOVE) ×4 IMPLANT
GOWN PREVENTION PLUS XLARGE (GOWN DISPOSABLE) ×3 IMPLANT
GOWN STRL NON-REIN LRG LVL3 (GOWN DISPOSABLE) ×3 IMPLANT
GOWN STRL REIN XL XLG (GOWN DISPOSABLE) ×3 IMPLANT
KIT BASIN OR (CUSTOM PROCEDURE TRAY) ×2 IMPLANT
NS IRRIG 1000ML POUR BTL (IV SOLUTION) ×2 IMPLANT
PACK GENERAL/GYN (CUSTOM PROCEDURE TRAY) ×2 IMPLANT
RELOAD LINEAR CUT PROX 55 BLUE (ENDOMECHANICALS) ×6 IMPLANT
RELOAD STAPLE 55 3.8 BLU REG (ENDOMECHANICALS) IMPLANT
SPONGE GAUZE 4X4 12PLY (GAUZE/BANDAGES/DRESSINGS) ×2 IMPLANT
SPONGE GAUZE 4X4 FOR O.R. (GAUZE/BANDAGES/DRESSINGS) ×1 IMPLANT
SPONGE LAP 18X18 X RAY DECT (DISPOSABLE) ×1 IMPLANT
STAPLER GUN LINEAR PROX 60 (STAPLE) ×1 IMPLANT
STAPLER PROXIMATE 55 BLUE (STAPLE) ×1 IMPLANT
STAPLER VISISTAT (STAPLE) ×1 IMPLANT
STAPLER VISISTAT 35W (STAPLE) ×1 IMPLANT
SUCTION POOLE TIP (SUCTIONS) ×1 IMPLANT
SUT NOVA 1 T20/GS 25DT (SUTURE) IMPLANT
SUT PROLENE 0 CT 1 CR/8 (SUTURE) ×1 IMPLANT
SUT SILK 2 0 (SUTURE) ×4
SUT SILK 2 0 SH CR/8 (SUTURE) ×1 IMPLANT
SUT SILK 2-0 18XBRD TIE 12 (SUTURE) IMPLANT
SUT SILK 3 0 (SUTURE) ×4
SUT SILK 3 0 SH CR/8 (SUTURE) ×3 IMPLANT
SUT SILK 3-0 18XBRD TIE 12 (SUTURE) IMPLANT
SUT VIC AB 3-0 SH 27 (SUTURE) ×4
SUT VIC AB 3-0 SH 27XBRD (SUTURE) ×2 IMPLANT
TAPE CLOTH SURG 4X10 WHT LF (GAUZE/BANDAGES/DRESSINGS) ×2 IMPLANT
TOWEL OR 17X26 10 PK STRL BLUE (TOWEL DISPOSABLE) ×4 IMPLANT
TRAY FOLEY CATH 14FRSI W/METER (CATHETERS) ×2 IMPLANT
YANKAUER SUCT BULB TIP NO VENT (SUCTIONS) ×2 IMPLANT

## 2011-02-27 NOTE — Anesthesia Preprocedure Evaluation (Signed)
Anesthesia Evaluation  Patient identified by MRN, date of birth, ID band Patient awake    Reviewed: Allergy & Precautions, H&P , NPO status , Patient's Chart, lab work & pertinent test results, reviewed documented beta blocker date and time   Airway Mallampati: I TM Distance: >3 FB Neck ROM: Full    Dental   Pulmonary neg pulmonary ROS,  clear to auscultation        Cardiovascular neg cardio ROS Regular Normal    Neuro/Psych Negative Neurological ROS  Negative Psych ROS   GI/Hepatic Neg liver ROS, Crohn's Dz,  Steroid coverage?   Endo/Other  Negative Endocrine ROS  Renal/GU negative Renal ROS  Genitourinary negative   Musculoskeletal negative musculoskeletal ROS (+)   Abdominal   Peds negative pediatric ROS (+)  Hematology negative hematology ROS (+)   Anesthesia Other Findings   Reproductive/Obstetrics negative OB ROS                           Anesthesia Physical Anesthesia Plan  ASA: III  Anesthesia Plan: General   Post-op Pain Management:    Induction: Intravenous, Rapid sequence and Cricoid pressure planned  Airway Management Planned: Oral ETT  Additional Equipment:   Intra-op Plan:   Post-operative Plan: Extubation in OR  Informed Consent: I have reviewed the patients History and Physical, chart, labs and discussed the procedure including the risks, benefits and alternatives for the proposed anesthesia with the patient or authorized representative who has indicated his/her understanding and acceptance.   Dental advisory given  Plan Discussed with: CRNA and Surgeon  Anesthesia Plan Comments:         Anesthesia Quick Evaluation

## 2011-02-27 NOTE — Transfer of Care (Signed)
Immediate Anesthesia Transfer of Care Note  Patient: Robert Jenkins  Procedure(s) Performed:  EXPLORATORY LAPAROTOMY - ileocecotomy  Patient Location: PACU  Anesthesia Type: General  Level of Consciousness: awake, alert  and oriented  Airway & Oxygen Therapy: Patient Spontanous Breathing and Patient connected to face mask oxygen  Post-op Assessment: Report given to PACU RN, Post -op Vital signs reviewed and stable and Patient complaining of pain.  Post vital signs: Reviewed and stable  Complications: No apparent anesthesia complications

## 2011-02-27 NOTE — Anesthesia Postprocedure Evaluation (Signed)
  Anesthesia Post-op Note  Patient: Robert Jenkins  Procedure(s) Performed:  EXPLORATORY LAPAROTOMY - ileocecotomy  Patient Location: PACU  Anesthesia Type: General  Level of Consciousness: oriented and sedated  Airway and Oxygen Therapy: Patient Spontanous Breathing and Patient connected to nasal cannula oxygen  Post-op Pain: mild  Post-op Assessment: Post-op Vital signs reviewed, Patient's Cardiovascular Status Stable, Respiratory Function Stable and Patent Airway  Post-op Vital Signs: stable  Complications: No apparent anesthesia complications

## 2011-02-27 NOTE — Progress Notes (Signed)
  Subjective: Met with pt this am. Still having some RLQ pain. Tolerated prep we.. BMs clear No new c/o or questions  Objective: Vital signs in last 24 hours: Temp:  [97.6 F (36.4 C)-98 F (36.7 C)] 97.6 F (36.4 C) (12/10 0620) Pulse Rate:  [55-68] 55  (12/10 0620) Resp:  [18-19] 19  (12/10 0620) BP: (102-107)/(55-67) 106/62 mmHg (12/10 0620) SpO2:  [97 %-99 %] 99 % (12/10 0620) Last BM Date: 02/24/11  Intake/Output this shift:    Physical Exam: BP 106/62  Pulse 55  Temp(Src) 97.6 F (36.4 C) (Oral)  Resp 19  Ht 5' 10"  (1.778 m)  Wt 154 lb (69.854 kg)  BMI 22.10 kg/m2  SpO2 99% Abdomen: tender RLQ, no mass  Labs: CBC  Basename 02/27/11 0402 02/26/11 0420  WBC 9.1 10.2  HGB 14.8 15.5  HCT 45.0 46.4  PLT 247 289   BMET  Basename 02/27/11 0402 02/26/11 0420  NA 136 137  K 3.2* 3.7  CL 98 98  CO2 31 32  GLUCOSE 119* 105*  BUN 12 12  CREATININE 1.29 1.13  CALCIUM 9.1 9.7   LFT  Basename 02/25/11 0325  PROT 6.7  ALBUMIN 3.1*  AST 44*  ALT 94*  ALKPHOS 72  BILITOT 0.7  BILIDIR --  IBILI --  LIPASE --   PT/INR  Basename 02/27/11 0402  LABPROT 13.5  INR 1.01   ABG No results found for this basename: PHART:2,PCO2:2,PO2:2,HCO3:2 in the last 72 hours  Studies/Results: No results found.  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 D/W pt and father procedure and risks, informed consent obtained. PLan Foley, NG post op X 24hrs.  LOS: 3 days    Chennel Olivos J 02/27/2011

## 2011-02-27 NOTE — Op Note (Signed)
Robert Jenkins, Robert Jenkins NO.:  [id number]  MEDICAL RECORD NO.:  [id number]  LOCATION:  [facility]                         FACILITY:  [facility]  PHYSICIAN:  Anselm Pancoast. Krish Bailly, M.D.DATE OF BIRTH:  1980-10-03  DATE OF PROCEDURE:  02/27/2011 DATE OF DISCHARGE:                              OPERATIVE REPORT   PREOPERATIVE DIAGNOSIS:  Crohn disease, terminal ileum with entero- entero fistula.  OPERATION:  Resection of the terminal ileum and a limited right colectomy with an ileo-proximal transverse colon anastomosis.  ANESTHESIA:  General anesthesia.  SURGEON:  Anselm Pancoast. Zachery Dakins, M.D.  ASSISTANT:  Brayton El, PA-C  HISTORY:  Robert Jenkins is a 30 year old Caucasian male who was admitted last week by Dr. Erick Blinks to [facility] with worsening of his known Crohn's disease.  The patient says that approximately a year ago, he had a laparoscopic appendectomy and his family says that there was some inflammation noted at that time.  I do not think it was recognized as Crohn's disease, but then in September after an about 30-pound weight loss he was admitted and found to have Crohn's disease with a fistula from the distal ileum to another loop of the distal ileum.  The patient was placed on medical management and release, but his symptoms became more symptomatic with cramping, bloating, pretty significant abdominal pain and he was readmitted here to [facility] under the gastroenterology service.  The patient was seen in consultation by the general surgeons.  I think Dr. Rhea Belton saw him last week and over the weekend he was seen by Dr. Johna Sheriff and Dr. Gerrit Friends and was set up for me to do a resection of the terminal ileum this week or today.  I met the patient this morning, and on discussing with the patient, it appears that he has done well except for the last 3 months, and he has been on steroids and other medications, and I think his disease has  been evaluated predominantly by CTs.  The patient was in agreement to proceed with surgery.  He is on Zosyn and trying to sign in for this overall.  He is not anemic and he is not obstructed.  He has had a bowel prep over the weekend.  I think he had a lot of diarrhea yesterday and this morning.  His potassium was slightly low, otherwise he is in good preop condition and his hemoglobin and hematocrit are 14.8 hemoglobin and 45 hematocrit and a white count of 9100.  He was given a dose of Zosyn preoperatively and understands that he will have a Foley catheter and NG tube, this permit had been signed.  I discussed with him this morning about his planned surgery.  We do not think that a colostomy will be needed or an ileostomy.  I am trying to look back on where he had his appendectomy and his path report, but I do not have that information at this time.  The patient was taken to the operative suite, induction of general anesthesia, endotracheal tube was placed, tube was placed into the stomach and then Foley catheter was placed sterilely to straight drainage and the abdomen was prepped with Betadine surgical  scrub and draped in a sterile manner.  A time-out was completed and Dr. Caryn Bee had scrubbed in as there were no available other general surgeons.  The patient's abdomen after draping ed in a sterile manner and time-out completed, a small incision was made below the umbilicus.  This morning, when I examined he appeared tender in the right lower quadrant, but after he was administered general anesthesia, he could feel fullness, but it is definitely not a great big mass.  I carefully opened through the thin layer of adipose tissue and the fascia below the umbilicus and I carefully entered into the peritoneal cavity.  I made about a 3-inch incision below the umbilicus and he could definitely feel the inflammatory mass and I elected to extend the incision to about 2 inches above the  umbilicus to give a little better exposure.  On questioning, as far as the upper abdomen, on the inspection, I could feel the NG tube.  I could feel the definite mass in the right lower quadrant.  His appendix of course was removed and there was no evidence of any peritonitis.  I then elected to make the incision a little bit more, so I got probably a 5 inch incision all total and then with Caryn Bee retracting in the lower abdomen the incision was not really big enough for a Balfour.  I could free up the lateral peritoneal reflection off the cecal area, and then the mass that was predominantly in the right lower quadrant could be elevated.  You could see where the loop of small bowel went back and then a little fistula going to the more distal terminal ileum, and all of this goes up to about an inch from the terminal ileum.  I elected to divide the mesentery carefully between [facility] and these were either tied with 2-0 silk sutures and then the little area proximally.  There was a little area going into the terminal ileum, right proximal to the inflammation that I think was an inflammatory adhesion, but the mesentery goes real close to that area and I elected to use an area just proximal of that as the area of the proximal transection of the terminal ileum.  I divided the ileocecal bowel really right at the ileocecal valve and was close to the appendix and then the  suction proximally was removed after use of the GIA to remove that proximal area also.  I then put a suture on the most distal area, sent it for the pathologist to examine it and she said there was active Crohn's disease right on to the ileocecal valve area, so with this plan I thought it would be better to remove probably the cecum and removed about 3 inches off the last of the true cecum and then did the anastomosis into the right side of the transverse colon.  I was working from the left side and I could open up the peritoneum.   I then went ahead and closed the mesenteric defect, and used the distal ileum with the antimesenteric surface to the tinea anteriorly with a GIA 55 stapler, and enclosed the little enterotomy with inner layer of 3-0 Vicryl and then 3-0 Lembert sutures for a 2-lay reinforcement of the area.  The staple lines where I transected the small bowel, and I transected the colon, I did not turn those in since there were lying comfortably with good hemostasis.  Next, the small bowel was in an anatomical position.  I had run the small  bowel, there was no ileus. Then I placed the omentum over the anastomosis and then closed after changing gloves and irrigating the peritoneal cavity with a #1 looped PDS with some interrupted 0 Prolene sutures.  The skin was closed with staples and the patient tolerated the procedure nicely, Foley and NG tube in good position.  I will continue antibiotics for approximately 2 days.  We will taper him off the steroids and we will have laboratory studies drawn in the a.m.  We will use PCA morphine for pain control. The skin was closed with staples after the fascia sutures.     Anselm Pancoast. Zachery Dakins, M.D.     WJW/MEDQ  D:  02/27/2011  T:  02/27/2011  Job:  [id number]  cc:   Erick Blinks

## 2011-02-27 NOTE — Brief Op Note (Signed)
02/24/2011 - 02/27/2011  1:52 PM  PATIENT:  Robert Jenkins  30 y.o. male  PRE-OPERATIVE DIAGNOSIS:  crohn's disease terminal ileum with enteroentero fistula  POST-OPERATIVE DIAGNOSIS:  crohn's disease same  PROCEDURE:  Procedure(s): EXPLORATORY LAPAROTOMY with resection terminal ileum and cecum  SURGEON:  Surgeon(s): Willey Blade, MD    ASSISTANTS: Oren Bracket   ANESTHESIA:   general  EBL:  Total I/O In: 2050 [I.V.:2050] Out: 925 [Urine:775; Blood:150]  BLOOD ADMINISTERED:none  DRAINS: none   LOCAL MEDICATIONS USED:  NONE  SPECIMEN:  Excision ileum And cecum DISPOSITION OF SPECIMEN:  PATHOLOGY  COUNTS:  YES  TOURNIQUET:  * No tourniquets in log *  DICTATION: .Other Dictation: Dictation Number [id number]  PLAN OF CARE: inpatient  PATIENT DISPOSITION:  PACU - hemodynamically stable.

## 2011-02-27 NOTE — Progress Notes (Signed)
Pt. Returned from PACU @ 1450. NG put to intermitent low wall suction, draining brown liquid. Pt. Is very anxious begging for pain medicine every couple seconds. PCA dilaudid started as ordered with pulse oximetry. Pt. Encouraged to use the PCA. Family and pt instructed how to use the PCA. Only the pt. Is to push the button. Pt continued to ask me  To call the MD to have his pain medicine increased. Dr. Rise Patience called and stated he was not going to order a higher dose. Dr. Rise Patience came up to see and talk to the pt.. Dsg remained D/I to his mid abd. O2 @ 2L via [place]. NG flushed with 30 cc sterile water. NG drained 70 cc. Pt.'s brother pushed the PCA for him. Again family was instructed how it was to be used and why. Foley patent and draining yellow urine. Pt. Continued to cry out in pain stating the pain medicine wasn't working. Both parents at the bedside and brother.

## 2011-02-27 NOTE — Progress Notes (Signed)
Pt. States he has  Had 6 episodes of diarrhea through the night. Denies any pain. States his stool was yellow. Voided 200 cc urine @ 0845

## 2011-02-28 ENCOUNTER — Encounter (HOSPITAL_COMMUNITY): Payer: Self-pay | Admitting: General Surgery

## 2011-02-28 DIAGNOSIS — K5090 Crohn's disease, unspecified, without complications: Secondary | ICD-10-CM

## 2011-02-28 LAB — CBC
HCT: 42 % (ref 39.0–52.0)
Hemoglobin: 14 g/dL (ref 13.0–17.0)
WBC: 17.8 10*3/uL — ABNORMAL HIGH (ref 4.0–10.5)

## 2011-02-28 LAB — BASIC METABOLIC PANEL
BUN: 6 mg/dL (ref 6–23)
Chloride: 95 mEq/L — ABNORMAL LOW (ref 96–112)
GFR calc Af Amer: >90 mL/min (ref >90)
Glucose, Bld: 129 mg/dL — ABNORMAL HIGH (ref 70–99)
Potassium: 3.7 mEq/L (ref 3.5–5.1)
Sodium: 130 mEq/L — ABNORMAL LOW (ref 135–145)

## 2011-02-28 MED ORDER — ENOXAPARIN SODIUM 40 MG/0.4ML ~~LOC~~ SOLN
40.0000 mg | SUBCUTANEOUS | Status: DC
  Administered 2011-02-28 – 2011-03-05 (×6): 40 mg via SUBCUTANEOUS
  Filled 2011-02-28 (×6): qty 0.4

## 2011-02-28 MED ORDER — HYDROMORPHONE 0.3 MG/ML IV SOLN
INTRAVENOUS | Status: DC
  Administered 2011-02-28: 9.6 mg via INTRAVENOUS
  Administered 2011-03-01 (×2): via INTRAVENOUS
  Administered 2011-03-01: 8.7 mg via INTRAVENOUS
  Administered 2011-03-01: 2.4 mg via INTRAVENOUS
  Administered 2011-03-01: 9 mg via INTRAVENOUS
  Administered 2011-03-01: 9.13 mg via INTRAVENOUS
  Administered 2011-03-01: 9.6 mg via INTRAVENOUS
  Administered 2011-03-02: 2.4 mg via INTRAVENOUS
  Administered 2011-03-02: 2.7 mg via INTRAVENOUS
  Administered 2011-03-02: 06:00:00 via INTRAVENOUS
  Filled 2011-02-28 (×6): qty 25

## 2011-02-28 MED ORDER — PHENOL 1.4 % MT LIQD
1.0000 | OROMUCOSAL | Status: DC | PRN
  Filled 2011-02-28 (×2): qty 177

## 2011-02-28 MED ORDER — MENTHOL 3 MG MT LOZG
1.0000 | LOZENGE | OROMUCOSAL | Status: DC | PRN
  Filled 2011-02-28: qty 9

## 2011-02-28 NOTE — Progress Notes (Signed)
1 Day Post-Op  Subjective: Pt ok. Rough night of pain,spasm, now under better control. Foley out this am, already voiding on own. No flatus yet.  Objective: Vital signs in last 24 hours: Temp:  [97.4 F (36.3 C)-98.3 F (36.8 C)] 98.2 F (36.8 C) (12/11 5501) Pulse Rate:  [59-89] 89  (12/11 0638) Resp:  [13-28] 24  (12/11 5868) BP: (101-158)/(61-88) 112/70 mmHg (12/11 0638) SpO2:  [93 %-100 %] 99 % (12/11 2574) Last BM Date: 02/27/11  Intake/Output this shift:    Physical Exam: BP 112/70  Pulse 89  Temp(Src) 98.2 F (36.8 C) (Oral)  Resp 24  Ht 5' 10"  (1.778 m)  Wt 154 lb (69.854 kg)  BMI 22.10 kg/m2  SpO2 99% Lungs: CTA without w/r/r Heart: Regular Abdomen: soft, ND, appropriately tender   Incisions all c/d/i without erythema or hematoma. Ext: No edema or tenderness   Labs: CBC  Basename 02/28/11 0320 02/27/11 0402  WBC 17.8* 9.1  HGB 14.0 14.8  HCT 42.0 45.0  PLT 294 247   BMET  Basename 02/28/11 0320 02/27/11 0402  NA 130* 136  K 3.7 3.2*  CL 95* 98  CO2 26 31  GLUCOSE 129* 119*  BUN 6 12  CREATININE 0.88 1.29  CALCIUM 9.1 9.1   LFT No results found for this basename: PROT,ALBUMIN,AST,ALT,ALKPHOS,BILITOT,BILIDIR,IBILI,LIPASE in the last 72 hours PT/INR  Basename 02/27/11 0402  LABPROT 13.5  INR 1.01   ABG No results found for this basename: PHART:2,PCO2:2,PO2:2,HCO3:2 in the last 72 hours  Studies/Results: No results found.  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 OOB/IS Discussed reducing PCA use.  LOS: 4 days    Jasai Sorg J 02/28/2011

## 2011-02-28 NOTE — Progress Notes (Signed)
Patient ID: Robert Jenkins, male   DOB: [birth year], 30 y.o.   MRN: [id number]  Day #1 post op  Ileo -cecectomy for Crohns with inflammatory mass and fistula.  Rough night with pain, and problems with foley. Better this am.   We will follow peripherally. He will need follow up with Dr Arlyce Dice in the office in 6 weeks, consider  Starting immunosupressive  therapy

## 2011-02-28 NOTE — Progress Notes (Signed)
1 Day Post-Op  Subjective: Sp resection segment ileum  & cecum  Objective: Vital signs in last 24 hours: Temp:  [97.4 F (36.3 C)-98.3 F (36.8 C)] 98.2 F (36.8 C) (12/11 1914) Pulse Rate:  [59-89] 89  (12/11 0638) Resp:  [13-28] 24  (12/11 7829) BP: (101-158)/(61-88) 112/70 mmHg (12/11 0638) SpO2:  [93 %-100 %] 99 % (12/11 5621) Last BM Date: 02/27/11  Intake/Output this shift:    Physical Exam: BP 112/70  Pulse 89  Temp(Src) 98.2 F (36.8 C) (Oral)  Resp 24  Ht 5' 10"  (1.778 m)  Wt 154 lb (69.854 kg)  BMI 22.10 kg/m2  SpO2 99% Foley out encouraged to use IS poor cough effoert, NG little bilious keep ng today.  Labs: CBC  Basename 02/28/11 0320 02/27/11 0402  WBC 17.8* 9.1  HGB 14.0 14.8  HCT 42.0 45.0  PLT 294 247   BMET  Basename 02/28/11 0320 02/27/11 0402  NA 130* 136  K 3.7 3.2*  CL 95* 98  CO2 26 31  GLUCOSE 129* 119*  BUN 6 12  CREATININE 0.88 1.29  CALCIUM 9.1 9.1   LFT No results found for this basename: PROT,ALBUMIN,AST,ALT,ALKPHOS,BILITOT,BILIDIR,IBILI,LIPASE in the last 72 hours PT/INR  Basename 02/27/11 0402  LABPROT 13.5  INR 1.01   ABG No results found for this basename: PHART:2,PCO2:2,PO2:2,HCO3:2 in the last 72 hours  Studies/Results: No results found.  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

## 2011-03-01 ENCOUNTER — Other Ambulatory Visit: Payer: Self-pay

## 2011-03-01 DIAGNOSIS — K5090 Crohn's disease, unspecified, without complications: Secondary | ICD-10-CM

## 2011-03-01 LAB — BASIC METABOLIC PANEL
BUN: 7 mg/dL (ref 6–23)
CO2: 33 mEq/L — ABNORMAL HIGH (ref 19–32)
GFR calc non Af Amer: >90 mL/min (ref >90)
Glucose, Bld: 111 mg/dL — ABNORMAL HIGH (ref 70–99)
Potassium: 3.5 mEq/L (ref 3.5–5.1)
Sodium: 135 mEq/L (ref 135–145)

## 2011-03-01 LAB — CBC
HCT: 39.6 % (ref 39.0–52.0)
Hemoglobin: 12.9 g/dL — ABNORMAL LOW (ref 13.0–17.0)
MCH: 26.2 pg (ref 26.0–34.0)
MCHC: 32.6 g/dL (ref 30.0–36.0)
RBC: 4.93 MIL/uL (ref 4.22–5.81)

## 2011-03-01 MED ORDER — FAMOTIDINE IN NACL 20-0.9 MG/50ML-% IV SOLN
20.0000 mg | Freq: Two times a day (BID) | INTRAVENOUS | Status: DC
  Administered 2011-03-01 – 2011-03-05 (×8): 20 mg via INTRAVENOUS
  Filled 2011-03-01 (×14): qty 50

## 2011-03-01 NOTE — Progress Notes (Signed)
Nursing: Pt. Has c/o heartburn/acid reflux on and off during the night. Pt. C/o bout of hiccups that increased abd. Pain and reflux pain. Pt. C/o anxiety and SOB at the same time. EKG was obtained. Pt. States he feels that the pain is from heartburn and the result of the multiple hiccups. Pt. Requested Benadryl. 12.73m Benadryl was given. Pt. Would like a med for heartburn.

## 2011-03-01 NOTE — Progress Notes (Signed)
2 Days Post-Op  Subjective: Pt ok. C/o hiccups and heartburn. Pain control ok. Hasn't been OOB much. Voiding well. No flatus yet.  Objective: Vital signs in last 24 hours: Temp:  [97.6 F (36.4 C)-98.2 F (36.8 C)] 98.1 F (36.7 C) (12/12 0438) Pulse Rate:  [86-92] 92  (12/12 0438) Resp:  [16-20] 18  (12/12 0438) BP: (110-132)/(70-86) 132/85 mmHg (12/12 0500) SpO2:  [94 %-99 %] 99 % (12/12 0500) Last BM Date: 02/27/11  Intake/Output this shift:    Physical Exam: BP 132/85  Pulse 92  Temp(Src) 98.1 F (36.7 C) (Oral)  Resp 18  Ht 5' 10"  (1.778 m)  Wt 154 lb (69.854 kg)  BMI 22.10 kg/m2  SpO2 99% Abdomen: soft, less tender. ND, hypoactive BS. Ext: NT, no edema  Labs: CBC  Basename 03/01/11 0415 02/28/11 0320  WBC 11.8* 17.8*  HGB 12.9* 14.0  HCT 39.6 42.0  PLT 243 294   BMET  Basename 03/01/11 0415 02/28/11 0320  NA 135 130*  K 3.5 3.7  CL 96 95*  CO2 33* 26  GLUCOSE 111* 129*  BUN 7 6  CREATININE 0.98 0.88  CALCIUM 9.2 9.1   LFT No results found for this basename: PROT,ALBUMIN,AST,ALT,ALKPHOS,BILITOT,BILIDIR,IBILI,LIPASE in the last 72 hours PT/INR  Basename 02/27/11 0402  LABPROT 13.5  INR 1.01   ABG No results found for this basename: PHART:2,PCO2:2,PO2:2,HCO3:2 in the last 72 hours  Studies/Results: No results found.  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  LOS: 5 days    September Mormile J 03/01/2011

## 2011-03-01 NOTE — Progress Notes (Signed)
Subjective: *"*Rough night". Complaining of hiccups.*  Objective: Vital signs in last 24 hours: Temp:  [97.6 F (36.4 C)-98.2 F (36.8 C)] 98.1 F (36.7 C) (12/12 0438) Pulse Rate:  [86-92] 92  (12/12 0438) Resp:  [16-20] 20  (12/12 0943) BP: (110-132)/(70-86) 132/85 mmHg (12/12 0500) SpO2:  [94 %-99 %] 98 % (12/12 0943) Last BM Date: 02/27/11  General:   Alert,  Well-developed, well-nourished, pleasant and cooperative in NAD  Intake/Output from previous day: 12/11 0701 - 12/12 0700 In: 1075 [P.O.:240; I.V.:835] Out: 1375 [Urine:1375] Intake/Output this shift:    Lab Results:  Basename 03/01/11 0415 02/28/11 0320 02/27/11 0402  WBC 11.8* 17.8* 9.1  HGB 12.9* 14.0 14.8  HCT 39.6 42.0 45.0  PLT 243 294 247   BMET  Basename 03/01/11 0415 02/28/11 0320 02/27/11 0402  NA 135 130* 136  K 3.5 3.7 3.2*  CL 96 95* 98  CO2 33* 26 31  GLUCOSE 111* 129* 119*  BUN 7 6 12   CREATININE 0.98 0.88 1.29  CALCIUM 9.2 9.1 9.1   LFT No results found for this basename: PROT,ALBUMIN,AST,ALT,ALKPHOS,BILITOT,BILIDIR,IBILI in the last 72 hours PT/INR  Basename 02/27/11 0402  LABPROT 13.5  INR 1.01   Hepatitis Panel No results found for this basename: HEPBSAG,HCVAB,HEPAIGM,HEPBIGM in the last 72 hours   Studies/Results: No results found.  Assessment: *Crohn's ileitis-status post laparotomy**and ileocecectomy - stable postoperative course thus far  Recommendations Once recovered from surgery patient will require long-term immunosuppression because of high risk of recurrent disease. I will see him in the office in approximately 8 weeks.         Sandy Salaam. [name], MD, [facility] [phone]   Erskine Emery  03/01/2011, 9:51 AM

## 2011-03-01 NOTE — Progress Notes (Signed)
Pt walked 250' this am. And then 500' this afternoon. Improving activity but still not passing flatus. Kirkland Hun, RN

## 2011-03-01 NOTE — Progress Notes (Signed)
Patient ID: Robert Jenkins, male   DOB: 1981-03-10, 30 y.o.   MRN: [id number]  Request surgery to transfer pt to surgery service please.

## 2011-03-02 MED ORDER — OXYCODONE-ACETAMINOPHEN 5-325 MG PO TABS
1.0000 | ORAL_TABLET | Freq: Four times a day (QID) | ORAL | Status: DC | PRN
  Administered 2011-03-02 – 2011-03-03 (×3): 2 via ORAL
  Filled 2011-03-02 (×3): qty 2

## 2011-03-02 MED ORDER — HYDROMORPHONE HCL PF 1 MG/ML IJ SOLN
1.0000 mg | INTRAMUSCULAR | Status: DC | PRN
  Administered 2011-03-02 – 2011-03-04 (×9): 1 mg via INTRAVENOUS
  Filled 2011-03-02 (×10): qty 1

## 2011-03-02 NOTE — Progress Notes (Signed)
Patient ambulated to and from bathroom 4 times this shift, and also ambulated 250 feet in hallways.  Bowel sounds are present, but patient denies gas.  Pain is described as "gas buildup" this afternoon.  Sips of clear liquids and a few spoonfuls of jello have been tolerated well, although pain climbs occasionally to 8/10.  Will continue to monitor. Brandon Melnick. RN, BSN 03/02/2011 6:36 PM

## 2011-03-02 NOTE — Progress Notes (Signed)
3 Days Post-Op  Subjective: Pt feeling better, decreased PCA use. No flatus but hearing "grumblings" Walking a lot, voiding well.  Objective: Vital signs in last 24 hours: Temp:  [97.6 F (36.4 C)-98.3 F (36.8 C)] 98 F (36.7 C) (12/13 0616) Pulse Rate:  [80-104] 82  (12/13 0616) Resp:  [14-21] 16  (12/13 0616) BP: (104-123)/(59-77) 104/68 mmHg (12/13 0616) SpO2:  [93 %-100 %] 97 % (12/13 0616) FiO2 (%):  [97 %] 97 % (12/12 1444) Last BM Date: 02/27/11  Intake/Output this shift:    Physical Exam: BP 104/68  Pulse 82  Temp(Src) 98 F (36.7 C) (Oral)  Resp 16  Ht 5' 10"  (1.778 m)  Wt 154 lb (69.854 kg)  BMI 22.10 kg/m2  SpO2 97% Lungs: CTA without w/r/r Heart: Regular Abdomen: soft, ND, appropriately tender, few BS   Incision c/d/i without erythema or hematoma. Ext: No edema or tenderness   Labs: CBC  Basename 03/01/11 0415 02/28/11 0320  WBC 11.8* 17.8*  HGB 12.9* 14.0  HCT 39.6 42.0  PLT 243 294   BMET  Basename 03/01/11 0415 02/28/11 0320  NA 135 130*  K 3.5 3.7  CL 96 95*  CO2 33* 26  GLUCOSE 111* 129*  BUN 7 6  CREATININE 0.98 0.88  CALCIUM 9.2 9.1   LFT No results found for this basename: PROT,ALBUMIN,AST,ALT,ALKPHOS,BILITOT,BILIDIR,IBILI,LIPASE in the last 72 hours PT/INR No results found for this basename: LABPROT:2,INR:2 in the last 72 hours ABG No results found for this basename: PHART:2,PCO2:2,PO2:2,HCO3:2 in the last 72 hours  Studies/Results: No results found.  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

## 2011-03-03 MED ORDER — DIPHENHYDRAMINE HCL 50 MG/ML IJ SOLN: 25.0000 mg | Freq: Four times a day (QID) | INTRAMUSCULAR | Status: DC | PRN

## 2011-03-03 MED ORDER — ONDANSETRON HCL 4 MG PO TABS
4.0000 mg | ORAL_TABLET | ORAL | Status: DC | PRN
  Administered 2011-03-04 (×3): 4 mg via ORAL
  Filled 2011-03-03 (×3): qty 1

## 2011-03-03 MED ORDER — ONDANSETRON HCL 4 MG/2ML IJ SOLN
4.0000 mg | INTRAMUSCULAR | Status: DC | PRN
  Administered 2011-03-03 – 2011-03-04 (×2): 4 mg via INTRAVENOUS
  Filled 2011-03-03 (×2): qty 2

## 2011-03-03 MED ORDER — OXYCODONE-ACETAMINOPHEN 5-325 MG PO TABS
1.0000 | ORAL_TABLET | ORAL | Status: DC | PRN
  Administered 2011-03-04 – 2011-03-05 (×5): 2 via ORAL
  Filled 2011-03-03 (×5): qty 2

## 2011-03-03 MED ORDER — SIMETHICONE 80 MG PO CHEW
80.0000 mg | CHEWABLE_TABLET | Freq: Four times a day (QID) | ORAL | Status: DC | PRN
  Administered 2011-03-03: 80 mg via ORAL
  Filled 2011-03-03: qty 1

## 2011-03-03 NOTE — Progress Notes (Signed)
Pt seen and agree with note and treatment plan.

## 2011-03-03 NOTE — Plan of Care (Signed)
Pt had 2 liquid BMs during the night. Kirkland Hun, RN

## 2011-03-03 NOTE — Progress Notes (Signed)
BP 114/78  Pulse 81  Temp(Src) 97.8 F (36.6 C) (Oral)  Resp 18  Ht 5' 10"  (1.778 m)  Wt 154 lb (69.854 kg)  BMI 22.10 kg/m2  SpO2 98% Pt  Started on diet and loose stools. Wound ok I think his steroids could be stopped since he was only on these a few weeks. Labs in AM cbc Bmet. Probable discharge Sunday Staples out in office Thursday or Friday

## 2011-03-03 NOTE — Progress Notes (Signed)
4 Days Post-Op  Subjective: Pt ok. Multiple loose BMs. Pain control ok. No N/V, wants to eat more. Has been OOB/walking  Objective: Vital signs in last 24 hours: Temp:  [97.7 F (36.5 C)-98.1 F (36.7 C)] 97.7 F (36.5 C) (12/14 0610) Pulse Rate:  [81-87] 81  (12/14 0610) Resp:  [16-20] 20  (12/14 0610) BP: (104-120)/(60-78) 104/60 mmHg (12/14 0610) SpO2:  [96 %-99 %] 97 % (12/14 0610) Last BM Date: 02/27/11  Intake/Output this shift:    Physical Exam: BP 104/60  Pulse 81  Temp(Src) 97.7 F (36.5 C) (Oral)  Resp 20  Ht 5' 10"  (1.778 m)  Wt 154 lb (69.854 kg)  BMI 22.10 kg/m2  SpO2 97% Lungs: CTA without w/r/r Heart: Regular Abdomen: soft, ND, appropriately tender   Incisions all c/d/i without erythema or hematoma. Ext: No edema or tenderness   Labs: CBC  Basename 03/01/11 0415  WBC 11.8*  HGB 12.9*  HCT 39.6  PLT 243   BMET  Basename 03/01/11 0415  NA 135  K 3.5  CL 96  CO2 33*  GLUCOSE 111*  BUN 7  CREATININE 0.98  CALCIUM 9.2   LFT No results found for this basename: PROT,ALBUMIN,AST,ALT,ALKPHOS,BILITOT,BILIDIR,IBILI,LIPASE in the last 72 hours PT/INR No results found for this basename: LABPROT:2,INR:2 in the last 72 hours ABG No results found for this basename: PHART:2,PCO2:2,PO2:2,HCO3:2 in the last 72 hours  Studies/Results: No results found.  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 Hopefully home next 1-2 days  LOS: 7 days    Federico Flake 03/03/2011

## 2011-03-03 NOTE — Progress Notes (Signed)
INITIAL ADULT NUTRITION ASSESSMENT Date: 03/03/2011   Time: 1:16 PM Reason for Assessment: NPO/clear liquid x 7 days  ASSESSMENT: Male 30 y.o.  Dx: Abdominal pain   Hx:  Past Medical History  Diagnosis Date  . Complication of anesthesia     "mental recovery"   Related Meds:  Scheduled Meds:   . antiseptic oral rinse  15 mL Mouth Rinse q12n4p  . chlorhexidine  15 mL Mouth Rinse BID  . enoxaparin (LOVENOX) injection  40 mg Subcutaneous Q24H  . famotidine (PEPCID) IV  20 mg Intravenous Q12H  . DISCONTD: alvimopan  12 mg Oral BID   Continuous Infusions:   . dextrose 5 % and 0.45 % NaCl with KCl 20 mEq/L 75 mL/hr at 03/03/11 0412   PRN Meds:.diphenhydrAMINE, HYDROmorphone (DILAUDID) injection, menthol-cetylpyridinium, ondansetron, oxyCODONE-acetaminophen, phenol, DISCONTD: oxyCODONE-acetaminophen  Ht: 5' 10"  (177.8 cm)  Wt: 154 lb (69.854 kg)  Ideal Wt: 75.5kg % Ideal Wt: 92  Usual Wt: 79kg % Usual Wt: 88  Body mass index is 22.10 kg/(m^2).  Food/Nutrition Related Hx: Pt reports 20 pound weight loss since October 1st 2012 r/t pain with eating. POD#4 resection of terminal ileum and limited right colectomy with an ileo-proximal transverse colon anastomosis. Pt reports some nausea today, no stools yet, however 2 loose stools last night. Noted pt reported tolerating clear liquids well yesterday.   Labs:  CMP     Component Value Date/Time   NA 135 03/01/2011 0415   K 3.5 03/01/2011 0415   CL 96 03/01/2011 0415   CO2 33* 03/01/2011 0415   GLUCOSE 111* 03/01/2011 0415   BUN 7 03/01/2011 0415   CREATININE 0.98 03/01/2011 0415   CALCIUM 9.2 03/01/2011 0415   PROT 6.7 02/25/2011 0325   ALBUMIN 3.1* 02/25/2011 0325   AST 44* 02/25/2011 0325   ALT 94* 02/25/2011 0325   ALKPHOS 72 02/25/2011 0325   BILITOT 0.7 02/25/2011 0325   GFRNONAA >90 03/01/2011 0415   GFRAA >90 03/01/2011 0415    Intake/Output Summary (Last 24 hours) at 03/03/11 1323 Last data filed at 03/02/11  2100  Gross per 24 hour  Intake    100 ml  Output      0 ml  Net    100 ml   Last BM - 12/13  Diet Order: Clear Liquid  IVF:    dextrose 5 % and 0.45 % NaCl with KCl 20 mEq/L Last Rate: 75 mL/hr at 03/03/11 0412    Estimated Nutritional Needs:   LMBE:[id number] Protein:85-105g Fluid:2-2.4L  NUTRITION DIAGNOSIS: -Inadequate oral intake (NI-2.1).  Status: Ongoing -Pt meets criteria for severe PCM of chronic illness AEB 11% weight loss in past 3 months and <75% intake in the month   RELATED TO: pain with eating/post-op recovery  AS EVIDENCE BY: pt statement, clear liquid diet  MONITORING/EVALUATION(Goals): Advance diet as tolerated to low fiber diet.   EDUCATION NEEDS: -Education needs addressed - reviewed low fiber diet and provided pt with handout of this information and nutrition for Crohn's disease. Pt expressed understanding of information, RD phone number provided for pt to call with further questions.   INTERVENTION: Diet advancement per MD. Will monitor.   Dietitian # [phone]  DOCUMENTATION CODES Per approved criteria  -Severe malnutrition in the context of chronic illness    Glory Rosebush 03/03/2011, 1:16 PM

## 2011-03-04 LAB — BASIC METABOLIC PANEL
CO2: 31 mEq/L (ref 19–32)
Chloride: 97 mEq/L (ref 96–112)
Creatinine, Ser: 1.15 mg/dL (ref 0.50–1.35)

## 2011-03-04 LAB — CBC
HCT: 38.6 % — ABNORMAL LOW (ref 39.0–52.0)
MCV: 78.9 fL (ref 78.0–100.0)
Platelets: 239 10*3/uL (ref 150–400)
RBC: 4.89 MIL/uL (ref 4.22–5.81)
WBC: 6.9 10*3/uL (ref 4.0–10.5)

## 2011-03-04 NOTE — Progress Notes (Signed)
Patient ID: Robert Jenkins, male   DOB: [birth year], 30 y.o.   MRN: [id number] [facility] Surgery Progress Note:   5 Days Post-Op  Subjective: Feeling better.  No complaints.  Having some loose stools.   Objective: Vital signs in last 24 hours: Temp:  [97.2 F (36.2 C)-99.2 F (37.3 C)] 97.2 F (36.2 C) (12/15 0351) Pulse Rate:  [81-82] 81  (12/15 0351) Resp:  [16-18] 16  (12/15 0351) BP: (114-123)/(78-84) 116/78 mmHg (12/15 0351) SpO2:  [98 %-100 %] 100 % (12/15 0351)  Intake/Output from previous day: 12/14 0701 - 12/15 0700 In: 2381.3 [P.O.:360; I.V.:1921.3; IV Piggyback:100] Out: 1700 [Urine:1700] Intake/Output this shift:    Physical Exam:  Incison bland Lab Results:   [facility] 03/04/11 0339  WBC 6.9  HGB 12.8*  HCT 38.6*  PLT 239   BMET  Basename 03/04/11 0339  NA 135  K 3.9  CL 97  CO2 31  GLUCOSE 104*  BUN 8  CREATININE 1.15  CALCIUM 9.6   PT/INR No results found for this basename: LABPROT:2,INR:2 in the last 72 hours Studies/Results: No results found. Anti-infectives: Anti-infectives     Start     Dose/Rate Route Frequency Ordered Stop   02/27/11 1800  piperacillin-tazobactam (ZOSYN) IVPB 3.375 g       3.375 g 12.5 mL/hr over 240 Minutes Intravenous Every 8 hours 02/27/11 1536 03/01/11 1552   02/26/11 1000   erythromycin (E-MYCIN) tablet 500 mg        500 mg Oral 3 times daily 02/25/11 1954 02/26/11 2154   02/26/11 0600   neomycin (MYCIFRADIN) tablet 1,000 mg        1,000 mg Oral 3 times per day 02/25/11 1954 02/26/11 2154   02/24/11 1500   piperacillin-tazobactam (ZOSYN) IVPB 3.375 g  Status:  Discontinued        3.375 g 12.5 mL/hr over 240 Minutes Intravenous Every 8 hours 02/24/11 1414 02/27/11 1536          Assessment/Plan: Problem List: Patient Active Problem List  Diagnoses  . Abdominal pain, right lower quadrant  . Nonspecific abnormal findings on radiological and examination of gastrointestinal tract  . 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  Will advance to low fiber diet and plan discharge tomorrow 5 Days Post-Op    LOS: 8 days   Matt B. Hassell Done, MD, [facility] [phone] beeper [phone]  03/04/2011 9:02 AM

## 2011-03-04 NOTE — Plan of Care (Signed)
Problem: Phase II Progression Outcomes Goal: Pain controlled Outcome: Completed/Met Date Met:  03/04/11 Po pain med

## 2011-03-04 NOTE — Plan of Care (Signed)
Problem: Phase II Progression Outcomes Goal: Pain controlled Outcome: Progressing Educated pt on need to keep pain under control to be able to ambulate more to increase bowel motility.  Pt reluctant to take med due to afraid can't go home. Explained he can not go home if he is not walking more and his bowels are not moving.

## 2011-03-04 NOTE — Plan of Care (Signed)
Problem: Phase II Progression Outcomes Goal: Tolerating diet Outcome: Completed/Met Date Met:  03/04/11 Full liquid. Goal: Other Phase II Outcomes/Goals Outcome: Completed/Met Date Met:  03/04/11 Education Low Fiber Diet

## 2011-03-05 MED ORDER — ONDANSETRON HCL 4 MG PO TABS: 4.0000 mg | ORAL_TABLET | ORAL | Status: AC | PRN

## 2011-03-05 NOTE — Discharge Summary (Signed)
Physician Discharge Summary  Patient ID: Robert Jenkins MRN: [id number] DOB/AGE: 1980/11/14 30 y.o.  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 rate 18, height 5' 10"  (1.778 m), weight 154 lb (69.854 kg), SpO2 100.00%. Incision/Wound: healing.  Staples removed  Disposition: Home or Self Care  Discharge Orders    Future Orders Please Complete By Expires   DME Other see comment      Comments:   Remove staples and apply half inch steristrips   Diet - low sodium heart healthy      Increase activity slowly      No wound care        Current Discharge Medication List    START taking these medications   Details  ondansetron (ZOFRAN) 4 MG tablet Take 1 tablet (4 mg total) by mouth every 4 (four) hours as needed. Qty: 20 tablet, Refills: 0      STOP taking these medications     HYDROcodone-acetaminophen (NORCO) 5-325 MG per tablet      predniSONE (DELTASONE) 10 MG tablet        Follow-up Information    Follow up with Willey Blade, MD. Make an appointment in 1 week.   Contact information:   [facility], [address] [place] [phone]          Signed: Pedro Earls 03/05/2011, 11:45 AM

## 2011-03-05 NOTE — Progress Notes (Signed)
Pt. Given information on crohn's disease. Staples removed from abdomen and steri strips applied. Pt requested a 4x4 gauze cover his incision because it made him sick to look at it. DC to father who will be taking him home.

## 2011-03-05 NOTE — Progress Notes (Signed)
IV dc'd, gauze dsg. Intact. PT oob taking a shower, father in room with him. Staples removed as ordered, incision line is clean and dry. DSD reapplied. Pt. C/o of some discomfort when he is up moving around. DC instructions given.

## 2011-03-06 NOTE — Progress Notes (Signed)
Discharge summary sent to payer through [facility]  

## 2011-03-23 ENCOUNTER — Ambulatory Visit (INDEPENDENT_AMBULATORY_CARE_PROVIDER_SITE_OTHER): Payer: 59 | Admitting: Surgery

## 2011-03-23 ENCOUNTER — Encounter (INDEPENDENT_AMBULATORY_CARE_PROVIDER_SITE_OTHER): Payer: Self-pay | Admitting: Surgery

## 2011-03-23 VITALS — BP 122/79 | HR 82 | Temp 98.0°F | Resp 16 | Ht 69.0 in | Wt 139.0 lb

## 2011-03-23 DIAGNOSIS — K50013 Crohn's disease of small intestine with fistula: Secondary | ICD-10-CM

## 2011-03-23 DIAGNOSIS — K5000 Crohn's disease of small intestine without complications: Secondary | ICD-10-CM

## 2011-03-23 NOTE — Progress Notes (Signed)
This patient is seen in followup for Dr. Rise Patience, who has retired. He underwent resection of the terminal ileum and the right colon on 02/27/11 for active Crohn's disease with an entero-entero fistula. His pathology showed that he still had active inflammation at the resection margins. He has been doing reasonably well. His appetite is good, but he is concerned that he is not putting any weight back on. He is having frequent loose bowel movements sometimes 5 or 6 times a day. He has not yet seen Dr. [name] after his surgery. He is on no Crohn's medications at this time. The patient is needed to return to full activity. He has been exercising some, but I cautioned him against working out too vigorously as he is at risk for developing a ventral hernia in the immediate postoperative period. He inquired about using anabolic steroids or testosterone supplements. I told him that steroid use is not advisable but that he could consider using testosterone supplements.    Filed Vitals:   03/23/11 1142  BP: 122/79  Pulse: 82  Temp: 98 F (36.7 C)  Resp: 16    WDWN in NAD HEENT:  EOMI, sclera anicteric Neck:  No masses, no thyromegaly Lungs:  CTA bilaterally; normal respiratory effort CV:  Regular rate and rhythm; no murmurs Abd:  +bowel sounds, soft, non-tender, no masses Incision - well-healed, no sign of infection, no sign of hernia Ext:  Well-perfused; no edema Skin:  Warm, dry; no sign of jaundice  Imp: Crohn's disease with entero-entero fistula s/p resection Healing well post-op Frequent diarrhea  Plan: We called Dr. Kelby Fam office and scheduled the patient for a follow-up visit next week. He may use Imodium occasionally to slow down his bowel movements.  F/U 3 weeks.  Imogene Burn. Georgette Dover, MD, [facility]  03/23/2011 12:33 PM

## 2011-03-28 ENCOUNTER — Ambulatory Visit (INDEPENDENT_AMBULATORY_CARE_PROVIDER_SITE_OTHER): Payer: 59 | Admitting: Gastroenterology

## 2011-03-28 ENCOUNTER — Encounter: Payer: Self-pay | Admitting: Gastroenterology

## 2011-03-28 VITALS — BP 118/64 | HR 80 | Ht 69.0 in | Wt 144.0 lb

## 2011-03-28 DIAGNOSIS — K5000 Crohn's disease of small intestine without complications: Secondary | ICD-10-CM

## 2011-03-28 DIAGNOSIS — K50013 Crohn's disease of small intestine with fistula: Secondary | ICD-10-CM

## 2011-03-28 DIAGNOSIS — R197 Diarrhea, unspecified: Secondary | ICD-10-CM

## 2011-03-28 NOTE — Assessment & Plan Note (Addendum)
The patient is making an uneventful recovery. He reports seeing worms in his stool. He apparently had a similar problem 10 months ago although his primary care doctor does not have a record of this.  Recommendations #1 check stools for O&P #2 colonoscopy #3 patient will require immunosuppressive therapy for high risk of recurrent disease but I will defer this until we determine whether  he has a parasitic infection. 4) check TPMT phenotype

## 2011-03-28 NOTE — Patient Instructions (Signed)
You will go to the basement today for labs Your Colonoscopy is scheduled on 04/12/2011 at 2pm  Separate instructions given

## 2011-03-28 NOTE — Progress Notes (Signed)
History of Present Illness:  [name] is a 31 year old white male with Crohn's disease involving the terminal ileum and right colon who is here for followup after hospitalization with resection of an abscess, terminal ileum and right colon. Distal margins were positive for inflammation. His postoperative course has been unremarkable except for moderate diarrhea which has been subsiding over the last 4-5 days. He's without bleeding or pain. He has noticed worms in his stool. He apparently was treated for a similar problem approximately 9 months ago.    Review of Systems: He has lost about 20 pounds is concerned about regaining his weight.  Pertinent positive and negative review of systems were noted in the above HPI section. All other review of systems were otherwise negative.    Current Medications, Allergies, Past Medical History, Past Surgical History, Family History and Social History were reviewed in [place] record  Vital signs were reviewed in today's medical record. Physical Exam: General: Well developed , well nourished, no acute distress Head: Normocephalic and atraumatic Eyes:  sclerae anicteric, EOMI Ears: Normal auditory acuity Mouth: No deformity or lesions Lungs: Clear throughout to auscultation Heart: Regular rate and rhythm; no murmurs, rubs or bruits Abdomen: Soft, non tender and non distended. No masses, hepatosplenomegaly or hernias noted. Normal Bowel sounds Rectal:deferred Musculoskeletal: Symmetrical with no gross deformities  Pulses:  Normal pulses noted Extremities: No clubbing, cyanosis, edema or deformities noted Neurological: Alert oriented x 4, grossly nonfocal Psychological:  Alert and cooperative. Normal mood and affect

## 2011-03-30 ENCOUNTER — Telehealth: Payer: Self-pay | Admitting: *Deleted

## 2011-03-30 NOTE — Telephone Encounter (Signed)
Message copied by Oda Kilts on Thu Mar 30, 2011  8:17 AM ------      Message from: Erskine Emery D      Created: Wed Mar 29, 2011  9:08 AM       Pt needs a TPMT phenotype

## 2011-04-10 ENCOUNTER — Telehealth: Payer: Self-pay | Admitting: Gastroenterology

## 2011-04-10 MED ORDER — PEG-KCL-NACL-NASULF-NA ASC-C 100 G PO SOLR: 1.0000 | Freq: Once | ORAL | Status: DC

## 2011-04-10 NOTE — Telephone Encounter (Signed)
Dr [name] I explained to this pt that you wanted him to have the TPMT Enzyme test done, that he would have to come into the office and sign a waiver first for the expense which is 220$  He wanted me to let you know he can not afford the test right now, its all he can do to have this colonoscopy done on Wednesday right now and he has to many hospital bill to pay and catch up on....Marland KitchenMarland Kitchen

## 2011-04-10 NOTE — Telephone Encounter (Signed)
ok 

## 2011-04-12 ENCOUNTER — Other Ambulatory Visit: Payer: Self-pay | Admitting: *Deleted

## 2011-04-12 ENCOUNTER — Encounter (INDEPENDENT_AMBULATORY_CARE_PROVIDER_SITE_OTHER): Payer: Self-pay | Admitting: Surgery

## 2011-04-12 ENCOUNTER — Ambulatory Visit (AMBULATORY_SURGERY_CENTER): Payer: 59 | Admitting: Gastroenterology

## 2011-04-12 ENCOUNTER — Other Ambulatory Visit: Payer: 59

## 2011-04-12 ENCOUNTER — Encounter: Payer: Self-pay | Admitting: Gastroenterology

## 2011-04-12 VITALS — BP 120/58 | HR 88 | Temp 97.8°F | Resp 20 | Ht 69.0 in | Wt 144.0 lb

## 2011-04-12 DIAGNOSIS — K5000 Crohn's disease of small intestine without complications: Secondary | ICD-10-CM

## 2011-04-12 DIAGNOSIS — K50013 Crohn's disease of small intestine with fistula: Secondary | ICD-10-CM

## 2011-04-12 DIAGNOSIS — K5090 Crohn's disease, unspecified, without complications: Secondary | ICD-10-CM

## 2011-04-12 DIAGNOSIS — R197 Diarrhea, unspecified: Secondary | ICD-10-CM

## 2011-04-12 DIAGNOSIS — K5289 Other specified noninfective gastroenteritis and colitis: Secondary | ICD-10-CM

## 2011-04-12 MED ORDER — SODIUM CHLORIDE 0.9 % IV SOLN: 500.0000 mL | INTRAVENOUS | Status: DC

## 2011-04-12 NOTE — Progress Notes (Signed)
Patient did not experience any of the following events: a burn prior to discharge; a fall within the facility; wrong site/side/patient/procedure/implant event; or a hospital transfer or hospital admission upon discharge from the facility. (G8907) Patient did not have preoperative order for IV antibiotic SSI prophylaxis. (G8918)  

## 2011-04-12 NOTE — Op Note (Signed)
[facility] [address] [facility] [address]. [place], [place]  [id number]  COLONOSCOPY PROCEDURE REPORT  PATIENT:  Robert, Jenkins  MR#:  [id number] BIRTHDATE:  [birth year], 30 yrs. old  GENDER:  male ENDOSCOPIST:  Sandy Salaam. [name], MD REF. BY:  Laurian Brim, M.D. PROCEDURE DATE:  04/12/2011 PROCEDURE:  Colonoscopy with biopsy ASA CLASS:  Class II INDICATIONS:  Crohn's disease MEDICATIONS:   MAC sedation, administered by CRNA propofol 436mIV  DESCRIPTION OF PROCEDURE:   After the risks benefits and alternatives of the procedure were thoroughly explained, informed consent was obtained.  Digital rectal exam was performed and revealed no abnormalities.   The LB [id number] was introduced through the anus and advanced to the ileum, without limitations.  The quality of the prep was excellent, using MoviPrep.  The instrument was then slowly withdrawn as the colon was fully examined. <<PROCEDUREIMAGES>>  FINDINGS:  Abnormal appearing mucosa. Inflamed, edematous mucosa at the anastamosis extending slightly into colon. Bxs taken (see image3 and image5).  This was otherwise a normal examination of the colon (see image1 and image6).   Retroflexed views in the rectum revealed no abnormalities.    The time to cecum =  1) 3.25 minutes. The scope was then withdrawn in  1) 6.0  minutes from the cecum and the procedure completed. COMPLICATIONS:  None ENDOSCOPIC IMPRESSION: 1) Abnormal mucosa - Active Crohn's at anastamosis 2) Otherwise normal examination RECOMMENDATIONS: 1) Call office for follow-up appointment in 3 weeks to discuss findings REPEAT EXAM:  No  ______________________________ RSandy Salaam KDeatra Ina MD  CC:  n. eSIGNED:   RSandy Salaam Dawayne Ohair at 04/12/2011 02:45 PM  BCassell Smiles [phone]

## 2011-04-12 NOTE — Patient Instructions (Addendum)
Crohn's Disease Crohn's disease is a long-term (chronic) soreness and redness (inflammation) of the intestines (bowel). It can affect any portion of the digestive tract, from the mouth to the anus. It can also cause problems outside the digestive tract. Crohn's disease is closely related to a disease called ulcerative colitis (together, these two diseases are called inflammatory bowel disease).  CAUSES  The cause of Crohn's disease is not known. One Link Snuffer is that, in an easily affected (susceptible) person, the immune system is triggered to attack the body's own digestive tissue. Crohn's disease runs in families. It seems to be more common in certain geographic areas and amongst certain races. There are no clear-cut dietary causes.  SYMPTOMS  Crohn's disease can cause many different symptoms since it can affect many different parts of the body. Symptoms include:  Fatigue.   Weight loss.   Chronic diarrhea, sometime bloody.   Abdominal pain and cramps.   Fever.   Ulcers or canker sores in the mouth or rectum.   Anemia (low red blood cells).   Arthritis, skin problems, and eye problems may occur.  Complications of Crohn's disease can include:  Series of holes (perforation) of the bowel.   Portions of the intestines sticking to each other (adhesions).   Obstruction of the bowel.   Fistula formation, typically in the rectal area but also in other areas. A fistula is an opening between the bowels and the outside, or between the bowels and another organ.   A painful crack in the mucous membrane of the anus (rectal fissure).  DIAGNOSIS  Your caregiver may suspect Crohn's disease based on your symptoms and an exam. Blood tests may confirm that there is a problem. You may be asked to submit a stool specimen for examination. X-rays and CT scans may be necessary. Ultimately, the diagnosis is usually made after a procedure that uses a flexible tube that is inserted via your mouth or your anus.  This is done under sedation and is called either an upper endoscopy or colonoscopy. With these tests, the specialist can take tiny tissue samples and remove them from the inside of the bowel (biopsy). Examination of this biopsy tissue under a microscope can reveal Crohn's disease as the cause of your symptoms. Due to the many different forms that Crohn's disease can take, symptoms may be present for several years before a diagnosis is made. HOME CARE INSTRUCTIONS   There is no cure for Crohn's disease. The best treatment is frequent checkups with your caregiver.   Symptoms such as diarrhea can be controlled with medications. Avoid foods that have a laxative effect such as fresh fruit, vegetables and dairy products. During flare ups, you can rest your bowel by refraining from solid foods. Drink clear liquids frequently during the day (electrolyte or re-hydrating fluids are best. Your caregiver can help you with suggestions). Drink often to prevent loss of body fluids (dehydration). When diarrhea has cleared, eat small meals and more frequently. Avoid food additives and stimulants such as caffeine (coffee, tea, or chocolate). Enzyme supplements may help if you develop intolerance to a sugar in dairy products (lactose). Ask your caregiver or dietitian about specific dietary instructions.   Try to maintain a positive attitude. Learn relaxation techniques such as self hypnosis, mental imaging, and muscle relaxation.   If possible, avoid stresses which can aggravate your condition.   Exercise regularly.   Follow your diet.   Always get plenty of rest.  SEEK MEDICAL CARE IF:   Your symptoms  fail to improve after a week or two of new treatment.   You experience continued weight loss.   You have ongoing crampy digestion or loose bowels.   You develop a new skin rash, skin sores, or eye problems.  SEEK IMMEDIATE MEDICAL CARE IF:   You have worsening of your symptoms or develop new symptoms.   You  have a fever.   You develop bloody diarrhea.   You develop severe abdominal pain.  MAKE SURE YOU:   Understand these instructions.   Will watch your condition.   Will get help right away if you are not doing well or get worse.  Document Released: 12/14/2004 Document Revised: 11/16/2010 Document Reviewed: 11/12/2006 [facility] Patient Information 2012 [place].  Discharge instructions per blue and green sheets Please call [phone] to schedule an appointment to see dr [name] in 3 weeks. Call within the next 2-3 days to schedule this appointment

## 2011-04-13 ENCOUNTER — Ambulatory Visit (INDEPENDENT_AMBULATORY_CARE_PROVIDER_SITE_OTHER): Payer: 59 | Admitting: Surgery

## 2011-04-13 ENCOUNTER — Encounter (INDEPENDENT_AMBULATORY_CARE_PROVIDER_SITE_OTHER): Payer: Self-pay | Admitting: Surgery

## 2011-04-13 ENCOUNTER — Telehealth: Payer: Self-pay | Admitting: *Deleted

## 2011-04-13 VITALS — BP 116/78 | HR 84 | Temp 98.1°F | Resp 18 | Ht 70.0 in | Wt 148.4 lb

## 2011-04-13 DIAGNOSIS — K5000 Crohn's disease of small intestine without complications: Secondary | ICD-10-CM

## 2011-04-13 DIAGNOSIS — K50013 Crohn's disease of small intestine with fistula: Secondary | ICD-10-CM

## 2011-04-13 LAB — OVA AND PARASITE SCREEN: OP: NONE SEEN

## 2011-04-13 NOTE — Patient Instructions (Signed)
Full activity

## 2011-04-13 NOTE — Telephone Encounter (Signed)

## 2011-04-13 NOTE — Telephone Encounter (Deleted)
Follow up Call- Patient questions:  Do you have a fever, pain , or abdominal swelling? no Pain Score  0 *  Have you tolerated food without any problems? yes  Have you been able to return to your normal activities? yes  Do you have any questions about your discharge instructions: Diet   no Medications  no Follow up visit  no  Do you have questions or concerns about your Care? no  Actions: * If pain score is 4 or above: {ACTION; L[facility]I ENDO PAIN >4:21563::"No action needed, pain <4."}

## 2011-04-13 NOTE — Progress Notes (Signed)
The patient returns for post-op follow-up.  He had a colonoscopy by Dr. [name] yesterday which showed some inflammation at the anastomosis.  Biopsies are pending.  Dr. [name] is going to see him back in 2 weeks.  Otherwise the patient feels good.  His diarrhea has decreased to 2-3 times per day.  His incision is completely healed.  He is back working out and wants to return to full duty at work.  I gave him a note to return to full duty and we will be glad to see him back on a PRN basis.  Imogene Burn. Georgette Dover, MD, [facility]  04/13/2011 11:27 AM

## 2011-04-18 ENCOUNTER — Encounter: Payer: Self-pay | Admitting: *Deleted

## 2011-04-18 ENCOUNTER — Encounter: Payer: Self-pay | Admitting: Gastroenterology

## 2011-04-21 ENCOUNTER — Encounter: Payer: Self-pay | Admitting: *Deleted

## 2011-05-15 ENCOUNTER — Other Ambulatory Visit (INDEPENDENT_AMBULATORY_CARE_PROVIDER_SITE_OTHER): Payer: 59

## 2011-05-15 ENCOUNTER — Ambulatory Visit (INDEPENDENT_AMBULATORY_CARE_PROVIDER_SITE_OTHER): Payer: 59 | Admitting: Gastroenterology

## 2011-05-15 ENCOUNTER — Encounter: Payer: Self-pay | Admitting: Gastroenterology

## 2011-05-15 VITALS — BP 120/76 | HR 76 | Ht 70.0 in | Wt 164.6 lb

## 2011-05-15 DIAGNOSIS — K5090 Crohn's disease, unspecified, without complications: Secondary | ICD-10-CM

## 2011-05-15 DIAGNOSIS — K5000 Crohn's disease of small intestine without complications: Secondary | ICD-10-CM

## 2011-05-15 DIAGNOSIS — K50013 Crohn's disease of small intestine with fistula: Secondary | ICD-10-CM

## 2011-05-15 LAB — CBC WITH DIFFERENTIAL/PLATELET
Basophils Relative: 0.7 % (ref 0.0–3.0)
Eosinophils Relative: 6.9 % — ABNORMAL HIGH (ref 0.0–5.0)
HCT: 43.7 % (ref 39.0–52.0)
Hemoglobin: 14.2 g/dL (ref 13.0–17.0)
Lymphs Abs: 1.8 10*3/uL (ref 0.7–4.0)
Monocytes Relative: 10.8 % (ref 3.0–12.0)
Neutro Abs: 3.5 10*3/uL (ref 1.4–7.7)
RDW: 14.2 % (ref 11.5–14.6)

## 2011-05-15 LAB — HEPATIC FUNCTION PANEL
ALT: 126 U/L — ABNORMAL HIGH (ref 0–53)
Bilirubin, Direct: 0.1 mg/dL (ref 0.0–0.3)
Total Bilirubin: 0.3 mg/dL (ref 0.3–1.2)

## 2011-05-15 MED ORDER — AZATHIOPRINE 50 MG PO TABS: 50.0000 mg | ORAL_TABLET | Freq: Every day | ORAL | Status: DC

## 2011-05-15 NOTE — Assessment & Plan Note (Addendum)
He has persistent disease at the anastomosis and is at high risk for current symptoms.  Recommendations #1 begin Imuran 50 mg daily. If LFTs and CBC are stable I will increase his dose eventually 150 mg daily.

## 2011-05-15 NOTE — Progress Notes (Signed)
History of Present Illness:  Robert Jenkins has returned following colonoscopy for Crohn's disease. He status post resection of the distal small bowel and right colon for fistulous Crohn's complicated by an abscess. Colonoscopy demonstrated inflammation at the anastomosis. He remains asymptomatic. He is unable to afford TP MT phenotype testing.  Stool O&P studies were negative.    Review of Systems: Pertinent positive and negative review of systems were noted in the above HPI section. All other review of systems were otherwise negative.    Current Medications, Allergies, Past Medical History, Past Surgical History, Family History and Social History were reviewed in [place] record  Vital signs were reviewed in today's medical record. Physical Exam: General: Well developed , well nourished, no acute distress

## 2011-05-15 NOTE — Patient Instructions (Signed)
You will need to go to the basement for labs today You will need to have follow up labs in 2 and 4 months And follow up with Dr Robert Jenkins in 3 months

## 2011-05-18 ENCOUNTER — Telehealth: Payer: Self-pay | Admitting: *Deleted

## 2011-05-18 NOTE — Telephone Encounter (Signed)
Dr [name], I informed patient that he needed further labs drawn due to his LFT's being elevated,  He stated and wanted me to let you know that he just finished his steroids and that's the reason the test are elevated, He wanted me to find out from you if he still needed his other labs drawn. I told him I would ask you.  Let me know if he still needs them

## 2011-05-18 NOTE — Telephone Encounter (Signed)
Steroids don't necessary cause LFT abnormalities.  He still needs lab work.

## 2011-06-02 ENCOUNTER — Telehealth: Payer: Self-pay | Admitting: *Deleted

## 2011-06-02 DIAGNOSIS — K5090 Crohn's disease, unspecified, without complications: Secondary | ICD-10-CM

## 2011-06-02 NOTE — Telephone Encounter (Signed)
See other phone note.the patient to come in next week for labs

## 2011-06-02 NOTE — Telephone Encounter (Signed)
Message copied by Oda Kilts on Fri Jun 02, 2011  2:37 PM ------      Message from: Erskine Emery D      Created: Wed May 17, 2011 10:04 AM       He needs serologies for hepatitis A, B. and C, ANA, AMA, iron, TIBC and ferritin levels

## 2011-06-02 NOTE — Telephone Encounter (Signed)
Patient to come in next week for labs to be drawn

## 2011-06-05 ENCOUNTER — Other Ambulatory Visit (INDEPENDENT_AMBULATORY_CARE_PROVIDER_SITE_OTHER): Payer: 59

## 2011-06-05 DIAGNOSIS — K5090 Crohn's disease, unspecified, without complications: Secondary | ICD-10-CM

## 2011-06-05 LAB — IBC PANEL
Iron: 30 ug/dL — ABNORMAL LOW (ref 42–165)
Saturation Ratios: 5.2 % — ABNORMAL LOW (ref 20.0–50.0)

## 2011-06-05 LAB — FERRITIN: Ferritin: 9.1 ng/mL — ABNORMAL LOW (ref 22.0–322.0)

## 2011-06-06 LAB — HEPATITIS A ANTIBODY, IGM: Hep A IgM: NEGATIVE

## 2011-06-06 LAB — ANA: Anti Nuclear Antibody(ANA): NEGATIVE

## 2011-06-07 LAB — HEPATITIS C RNA QUANTITATIVE

## 2011-06-07 LAB — MITOCHONDRIAL ANTIBODIES: Mitochondrial M2 Ab, IgG: 1.58 — ABNORMAL HIGH (ref <0.91)

## 2011-06-13 ENCOUNTER — Telehealth: Payer: Self-pay | Admitting: Gastroenterology

## 2011-06-13 NOTE — Telephone Encounter (Signed)
Pt states he was only taking Imuran 4m daily. He also wanted his lab results.

## 2011-06-13 NOTE — Telephone Encounter (Signed)
Pt states that the medication Dr. [name] placed him on, Imuran, has made him feel a little down. States that it has messed up his stomach and he had had diarrhea. He also c/o pain under his rib cage on the left side. Pt states that he stopped the Imuran on Friday and the pain has gotten better but he still has diarrhea.   Pt is also asking about his lab results. Dr. [name] please advise.

## 2011-06-13 NOTE — Telephone Encounter (Signed)
Lower imuran to 156m qd and have him return next week.

## 2011-06-14 NOTE — Telephone Encounter (Signed)
Lab work is negative.  Hold Imuran. He needs an office visit.

## 2011-06-14 NOTE — Telephone Encounter (Signed)
Pt scheduled to see Dr. [name] 06/21/11@3 :15pm. Pt aware of appt date and time. Pt aware of Dr. Kelby Fam recommendations.

## 2011-06-21 ENCOUNTER — Ambulatory Visit (INDEPENDENT_AMBULATORY_CARE_PROVIDER_SITE_OTHER): Payer: 59 | Admitting: Gastroenterology

## 2011-06-21 ENCOUNTER — Encounter: Payer: Self-pay | Admitting: Gastroenterology

## 2011-06-21 ENCOUNTER — Other Ambulatory Visit (INDEPENDENT_AMBULATORY_CARE_PROVIDER_SITE_OTHER): Payer: 59

## 2011-06-21 VITALS — BP 102/68 | HR 90 | Ht 69.0 in | Wt 165.0 lb

## 2011-06-21 DIAGNOSIS — R7989 Other specified abnormal findings of blood chemistry: Secondary | ICD-10-CM

## 2011-06-21 DIAGNOSIS — K5000 Crohn's disease of small intestine without complications: Secondary | ICD-10-CM

## 2011-06-21 DIAGNOSIS — K5090 Crohn's disease, unspecified, without complications: Secondary | ICD-10-CM

## 2011-06-21 DIAGNOSIS — R945 Abnormal results of liver function studies: Secondary | ICD-10-CM | POA: Insufficient documentation

## 2011-06-21 DIAGNOSIS — K50013 Crohn's disease of small intestine with fistula: Secondary | ICD-10-CM

## 2011-06-21 LAB — HEPATIC FUNCTION PANEL
AST: 30 U/L (ref 0–37)
Albumin: 4 g/dL (ref 3.5–5.2)
Alkaline Phosphatase: 74 U/L (ref 39–117)
Total Bilirubin: 0.3 mg/dL (ref 0.3–1.2)

## 2011-06-21 NOTE — Progress Notes (Signed)
History of Present Illness:  Robert Jenkins has returned for followup of Crohn's disease. He stopped Imuran because he was feeling fatigued. Even off Imuran fatigue continues. He is having loose stools as well. He is without pain.    Review of Systems: Pertinent positive and negative review of systems were noted in the above HPI section. All other review of systems were otherwise negative.    Current Medications, Allergies, Past Medical History, Past Surgical History, Family History and Social History were reviewed in [facility] record  Vital signs were reviewed in today's medical record. Physical Exam: General: Well developed , well nourished, no acute distress

## 2011-06-21 NOTE — Assessment & Plan Note (Addendum)
Etiology unclear. Serologies were negative for acute or chronic hepatitis. He is hepatitis B. surface antibody positive.  Recommendations #1 repeat LFTs #2 check B12 level

## 2011-06-21 NOTE — Assessment & Plan Note (Addendum)
His main complaint is fatigue off all medications. This may have been exacerbated by Imuran.  Recommendations #1 restart Imuran 25 mg daily and slowly increase by 25 mg weekly to 125 mg a day.

## 2011-06-21 NOTE — Patient Instructions (Addendum)
We started Imuran 25 mg daily for 7 days.  If you feel well increase Imuran dose by 25 mg weekly to a maximum of 125 mg a day  Your physician has requested that you go to the basement for lab work before leaving today:

## 2011-07-03 ENCOUNTER — Telehealth: Payer: Self-pay | Admitting: Gastroenterology

## 2011-07-03 DIAGNOSIS — K5090 Crohn's disease, unspecified, without complications: Secondary | ICD-10-CM

## 2011-07-03 NOTE — Telephone Encounter (Signed)
We can start him on Humira. He will need a PPD and some literature on Humira

## 2011-07-03 NOTE — Telephone Encounter (Signed)
Pt calling wants to know if there is something else he can take for his crohn's. Pt states that the Imuran makes him feel bad, he states he feels like his heart races on it. States that when he stopped taking it he felt better. Dr. [name] please advise.

## 2011-07-04 ENCOUNTER — Telehealth: Payer: Self-pay

## 2011-07-04 MED ORDER — CYANOCOBALAMIN 1000 MCG/ML IJ SOLN: 1000.0000 ug | Freq: Once | INTRAMUSCULAR | Status: AC

## 2011-07-04 MED ORDER — ADALIMUMAB 40 MG/0.8ML ~~LOC~~ KIT: 40.0000 mg | PACK | SUBCUTANEOUS | Status: DC

## 2011-07-04 MED ORDER — ADALIMUMAB 40 MG/0.8ML ~~LOC~~ KIT: PACK | SUBCUTANEOUS | Status: DC

## 2011-07-04 NOTE — Telephone Encounter (Signed)
Pt to come in 07/05/11@3 :15pm for PPD skin test. Pt may get B12 injections at Dr. Laurian Brim office in [place], orders faxed to [phone]. Humira ordered.

## 2011-07-04 NOTE — Telephone Encounter (Signed)
Pt decided he wants to give the B12 injections at home. Rx sent to pharmacy.

## 2011-07-04 NOTE — Telephone Encounter (Signed)
Message copied by Algernon Huxley on Tue Jul 04, 2011  1:23 PM ------      Message from: Erskine Emery D      Created: Tue Jul 04, 2011 12:12 PM       He needs monthly injection      ----- Message -----         From: Maury Dus, RN         Sent: 07/04/2011   9:46 AM           To: Inda Castle, MD            Pt can get the B12 at his PCP office if we fax the orders over. How do you want the pt to receive the B12? Please advise.

## 2011-07-05 ENCOUNTER — Telehealth: Payer: Self-pay

## 2011-07-05 ENCOUNTER — Ambulatory Visit (INDEPENDENT_AMBULATORY_CARE_PROVIDER_SITE_OTHER): Payer: 59 | Admitting: Gastroenterology

## 2011-07-05 DIAGNOSIS — K5090 Crohn's disease, unspecified, without complications: Secondary | ICD-10-CM

## 2011-07-05 MED ORDER — HYOSCYAMINE SULFATE ER 0.375 MG PO TBCR: 0.3750 mg | EXTENDED_RELEASE_TABLET | Freq: Two times a day (BID) | ORAL | Status: DC

## 2011-07-05 NOTE — Telephone Encounter (Signed)
Pt states he started his Imuran again and is having pain again. Pt instructed to stay off of the imuran and we will start his humira on Friday when his ppd skin test is read. Pt verbalized understanding. Per Dr. [name] pt may take hyomax for discomfort. Rx sent to pharmacy.

## 2011-07-05 NOTE — Telephone Encounter (Signed)
Pt decided to self inject the B 12. Rx sent to pharmacy. Pt knows to call when he gets his humira prescription filled so he can come in for teaching. Humira information/literature to be given to pt when he comes for ppd today.

## 2011-07-07 ENCOUNTER — Other Ambulatory Visit: Payer: Self-pay

## 2011-07-07 LAB — TB SKIN TEST: Induration: 0

## 2011-07-07 MED ORDER — ADALIMUMAB 40 MG/0.8ML ~~LOC~~ KIT: PACK | SUBCUTANEOUS | Status: DC

## 2011-07-07 NOTE — Progress Notes (Signed)
Patient ID: Robert Jenkins., male   DOB: 1981-03-08, 31 y.o.   MRN: [id number] Patient was here today for Humira teaching.  He was shown how to inject the Humira pen x 3 then properly demonstrated self administration of the 4th pen.  He tolerated all well.  He will call back for any questions

## 2011-08-01 ENCOUNTER — Other Ambulatory Visit: Payer: Self-pay | Admitting: Gastroenterology

## 2011-11-23 ENCOUNTER — Telehealth: Payer: Self-pay

## 2011-11-23 NOTE — Telephone Encounter (Signed)
Received prior auth form from [facility]. Called and was informed that as of 9/1 pt must get his Humira from [facility]. Called [facility] at [phone] and set up account for pt. Called in Humira rx to [facility]. Call placed to pt, left message for him to call back.  Spoke with pt and he is aware. Prior auth done with [facility]. Prior Josem Kaufmann #[id number].

## 2011-11-23 NOTE — Telephone Encounter (Signed)
Robert Jenkins pt with history of crohn's disease. Pt was started on Humira in April. States the cramping he was having is much better but he still thinks he is having to many bowel movements. Pt wants to know if there is something he could have to help "settle his stomach?" Pt states that he has Levbid but it made him drowsy. States he is taking Imodium to help but pt wants to know if there is something else he can take. Dr. Hilarie Fredrickson as doc of the day please advise.

## 2011-11-24 NOTE — Telephone Encounter (Signed)
Agree with follow-up with Dr. [name] given his hx of Crohn's disease and relatively new start of Humira.  Robert Jenkins should be seen urgently for any fevers, chills, moderate to severe abd pain, bloody diarrhea. Robert Jenkins can use loperamide for the diarrhea while waiting to be seen.

## 2011-11-24 NOTE — Telephone Encounter (Signed)
Called pt and tried to schedule OV for Dr. [name]. Pt wants to see what Dr. [name] thinks. He states he does not want to pay the 30 dollar copay if he doesn't have to. Pt states he is not having any fevers, pain, or bleeding. Dr. [name] please advise.

## 2011-11-24 NOTE — Telephone Encounter (Signed)
Spoke with Dr. Hilarie Fredrickson and pt needs to be seen in the office. Pt is on Humira but is having diarrhea.

## 2011-11-27 NOTE — Telephone Encounter (Signed)
Pt scheduled to see Dr. [name] 12/04/11@3 :15pm. Pt aware of appt date and time.

## 2011-11-27 NOTE — Telephone Encounter (Signed)
He should be seen in the office

## 2011-12-04 ENCOUNTER — Encounter: Payer: Self-pay | Admitting: Gastroenterology

## 2011-12-04 ENCOUNTER — Ambulatory Visit (INDEPENDENT_AMBULATORY_CARE_PROVIDER_SITE_OTHER): Payer: 59 | Admitting: Gastroenterology

## 2011-12-04 VITALS — BP 132/74 | HR 88 | Ht 69.0 in | Wt 163.0 lb

## 2011-12-04 DIAGNOSIS — K5000 Crohn's disease of small intestine without complications: Secondary | ICD-10-CM

## 2011-12-04 DIAGNOSIS — K50013 Crohn's disease of small intestine with fistula: Secondary | ICD-10-CM

## 2011-12-04 NOTE — Assessment & Plan Note (Addendum)
Patient symptoms are fairly well-controlled with Humira. Since there is some activity at the anastomosis including the ileum I will  begin a trial of lialda 2.4 g daily

## 2011-12-04 NOTE — Progress Notes (Signed)
Problem-Crohn's disease at ileo colonic anastomosis  History of Present Illness:  Robert Jenkins has returned for followup of Crohn's disease. As last visit he was complaining of severe fatigue.  This has entirely subsided. He remains on Humira only. He decided not to restart Imuran. His main GI complaint is frequent loose stools only in the mornings. Once he gets to noon he is well. He denies rectal bleeding or abdominal pain.    Review of Systems: Pertinent positive and negative review of systems were noted in the above HPI section. All other review of systems were otherwise negative.    Current Medications, Allergies, Past Medical History, Past Surgical History, Family History and Social History were reviewed in [facility] record  Vital signs were reviewed in today's medical record. Physical Exam: General: Well developed , well nourished, no acute distress t

## 2011-12-04 NOTE — Patient Instructions (Addendum)
You will need to follow up in 6 weeks

## 2011-12-21 ENCOUNTER — Other Ambulatory Visit: Payer: Self-pay | Admitting: Gastroenterology

## 2011-12-21 NOTE — Telephone Encounter (Signed)
Order for 90 days supply called into [facility] for pt. Pt aware.

## 2011-12-22 ENCOUNTER — Telehealth: Payer: Self-pay | Admitting: Gastroenterology

## 2011-12-22 NOTE — Telephone Encounter (Signed)
Spoke with patient and he is calling [facility]. Told patient he had 12 refills on his rx. [facility] will [facility]

## 2011-12-29 ENCOUNTER — Telehealth: Payer: Self-pay | Admitting: Gastroenterology

## 2011-12-29 DIAGNOSIS — K5090 Crohn's disease, unspecified, without complications: Secondary | ICD-10-CM

## 2011-12-29 MED ORDER — BUDESONIDE 3 MG PO CP24: 9.0000 mg | ORAL_CAPSULE | ORAL | Status: DC

## 2011-12-29 NOTE — Telephone Encounter (Signed)
Pt aware. Pt wants to call back to scheduled the OV appt.

## 2011-12-29 NOTE — Telephone Encounter (Signed)
Discontinue lialda Begin Entocort 9 mg daily. He should return in 3-4 weeks

## 2011-12-29 NOTE — Telephone Encounter (Signed)
Pt states that he is taking his humira and lialda. Reports that he is still having frequent, loose bowel movements. States he is having about 4/day. Pt wants to know if there is something else he can take instead of the Lialda. Dr. [name] please advise.

## 2012-01-30 ENCOUNTER — Telehealth: Payer: Self-pay | Admitting: Gastroenterology

## 2012-01-30 DIAGNOSIS — K5090 Crohn's disease, unspecified, without complications: Secondary | ICD-10-CM

## 2012-01-30 MED ORDER — BUDESONIDE 3 MG PO CP24: 9.0000 mg | ORAL_CAPSULE | ORAL | Status: DC

## 2012-01-30 NOTE — Telephone Encounter (Signed)
L/m for pt that med was sent

## 2012-02-22 ENCOUNTER — Ambulatory Visit (INDEPENDENT_AMBULATORY_CARE_PROVIDER_SITE_OTHER): Payer: 59 | Admitting: Gastroenterology

## 2012-02-22 ENCOUNTER — Encounter: Payer: Self-pay | Admitting: Gastroenterology

## 2012-02-22 ENCOUNTER — Other Ambulatory Visit: Payer: 59

## 2012-02-22 ENCOUNTER — Other Ambulatory Visit: Payer: Self-pay | Admitting: Gastroenterology

## 2012-02-22 VITALS — BP 118/78 | HR 65 | Ht 69.0 in | Wt 159.0 lb

## 2012-02-22 DIAGNOSIS — K5010 Crohn's disease of large intestine without complications: Secondary | ICD-10-CM

## 2012-02-22 NOTE — Progress Notes (Signed)
History of Present Illness:  [name] has returned for followup of Crohn's disease. He has active Crohn's at the ileocolonic anastomosis. He remains on Humira. He tried lialda without improvement. Over the last month he's noted worsening diarrhea up to 6-8 times a day with severe urgency. He is unable to work. He is without pain or bleeding. He was started on Entocort but has not improved.    Review of Systems: Pertinent positive and negative review of systems were noted in the above HPI section. All other review of systems were otherwise negative.    Current Medications, Allergies, Past Medical History, Past Surgical History, Family History and Social History were reviewed in [facility] record  Vital signs were reviewed in today's medical record. Physical Exam: General: Well developed , well nourished, no acute distress

## 2012-02-22 NOTE — Assessment & Plan Note (Signed)
Patient is flaring despite every other week Humira. Symptoms did not respond to lialda and Entocort. Intercurrent infection and pseudomembranous colitis should be ruled out.    Recommendations #1 check stools for C. difficile toxin and C&S #2 begin prednisone 40 mg daily #3 check Humira drug levels and antibody levels

## 2012-02-26 ENCOUNTER — Telehealth: Payer: Self-pay | Admitting: Gastroenterology

## 2012-02-26 MED ORDER — PREDNISONE 10 MG PO TABS: ORAL_TABLET | ORAL | Status: DC

## 2012-02-26 NOTE — Telephone Encounter (Signed)
What medication was we suppose to send patient on Friday? He is calling. Was it Prednisone

## 2012-02-26 NOTE — Telephone Encounter (Signed)
Prednisone 62m tabs.  Begin 4 tabs qd C/b Friday

## 2012-02-27 ENCOUNTER — Other Ambulatory Visit: Payer: Self-pay | Admitting: Gastroenterology

## 2012-02-27 ENCOUNTER — Other Ambulatory Visit: Payer: 59

## 2012-02-27 ENCOUNTER — Encounter: Payer: Self-pay | Admitting: Gastroenterology

## 2012-02-27 ENCOUNTER — Telehealth: Payer: Self-pay | Admitting: Gastroenterology

## 2012-02-27 DIAGNOSIS — K5010 Crohn's disease of large intestine without complications: Secondary | ICD-10-CM

## 2012-02-27 NOTE — Telephone Encounter (Signed)
Dr [name], Robert Jenkins walked in today to the office. He needs this letter signed by you by Thursday morning at 8:30. He has a hearing to decide weather he can keep his job or not.   His employer does not believe he has been sick.  He said he discussed this with you. And his employer will not call you

## 2012-02-28 LAB — CLOSTRIDIUM DIFFICILE EIA: CDIFTX: NEGATIVE

## 2012-02-28 NOTE — Telephone Encounter (Signed)
done

## 2012-03-04 ENCOUNTER — Encounter: Payer: Self-pay | Admitting: Gastroenterology

## 2012-03-04 NOTE — Progress Notes (Signed)
Patient ID: Robert Gentile., male   DOB: Nov 12, 1980, 31 y.o.   MRN: [id number] No results demonstrated a normal serum amalimubab concentration and absent antibiotics.  Plan repeat colonoscopy to demonstrate if there is active disease. If so, I will switch his biologic

## 2012-03-07 ENCOUNTER — Telehealth: Payer: Self-pay | Admitting: *Deleted

## 2012-03-07 NOTE — Telephone Encounter (Signed)
Robert Emery, MD 03/04/2012 11:09 AM Signed  Patient ID: Robert Gentile., male DOB: Feb 24, 1981, 31 y.o. MRN: [id number]  No results demonstrated a normal serum amalimubab concentration and absent antibiotics.  Plan repeat colonoscopy to demonstrate if there is active disease. If so, I will switch his biologic           Not recorded

## 2012-03-15 MED ORDER — HYOSCYAMINE SULFATE 0.125 MG SL SUBL: 0.1250 mg | SUBLINGUAL_TABLET | SUBLINGUAL | Status: DC | PRN

## 2012-03-15 NOTE — Telephone Encounter (Signed)
Called patient today. 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 He has a lawsuit against them at this time. Dr [name] he wants me to inform you as soon as he can get some type of insurance going he will call back to schedule his colonoscopy

## 2012-03-27 NOTE — Telephone Encounter (Signed)
Dr Orson Aloe

## 2012-06-25 ENCOUNTER — Telehealth: Payer: Self-pay | Admitting: Gastroenterology

## 2012-06-25 NOTE — Telephone Encounter (Signed)
[name] contact this patient in the morning about a letter he needs typed up for his lawyer for back pay on his previous job. I have one from before. But I think it needs a current date...Robert KitchenMarland Kitchen

## 2012-06-25 NOTE — Telephone Encounter (Signed)
Needs to talk with you about getting an affidavit

## 2012-06-25 NOTE — Telephone Encounter (Signed)
Robert Jenkins, This patient needs a letter written for him by Thursday like one he

## 2012-06-26 ENCOUNTER — Telehealth: Payer: Self-pay

## 2012-06-26 NOTE — Telephone Encounter (Signed)
Per Shirlean Mylar, who is out of the office for the rest of the week, I called patient regarding the note he needed for work regarding his Crohn's disease.  I shared with him the letter that had been written by Dr. [name] in the past to see if it would suffice and he told me his attorney wanted the letter to contain some more details and information.  He also said that he actually did not need the letter until 07-04-12 and would just wait until Robin and Dr. [name] were back in the office and discuss it with them.

## 2012-07-01 ENCOUNTER — Telehealth: Payer: Self-pay | Admitting: Gastroenterology

## 2012-07-02 NOTE — Telephone Encounter (Signed)
Called pt to discuss what he needs in the letter for the lawyer Will type up tomorrow

## 2012-07-03 ENCOUNTER — Telehealth: Payer: Self-pay | Admitting: Gastroenterology

## 2012-07-03 ENCOUNTER — Telehealth: Payer: Self-pay | Admitting: *Deleted

## 2012-07-03 NOTE — Telephone Encounter (Signed)
Faxed letter to the lawyer pt informed

## 2012-07-03 NOTE — Telephone Encounter (Signed)
Faxed letter to lawyer spoke to lawyer
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
## 2012-07-03 NOTE — Telephone Encounter (Signed)
Note sent to Genella Mech, CMA. She has made changes to the letter for Dr Kelby Fam approval and signature.
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
## 2012-09-09 ENCOUNTER — Ambulatory Visit (INDEPENDENT_AMBULATORY_CARE_PROVIDER_SITE_OTHER): Payer: BC Managed Care – PPO | Admitting: Gastroenterology

## 2012-09-09 ENCOUNTER — Encounter: Payer: Self-pay | Admitting: Gastroenterology

## 2012-09-09 VITALS — BP 104/60 | HR 100 | Ht 69.0 in | Wt 174.4 lb

## 2012-09-09 DIAGNOSIS — K5000 Crohn's disease of small intestine without complications: Secondary | ICD-10-CM

## 2012-09-09 DIAGNOSIS — K50013 Crohn's disease of small intestine with fistula: Secondary | ICD-10-CM

## 2012-09-09 NOTE — Patient Instructions (Addendum)
Follow up as needed

## 2012-09-09 NOTE — Assessment & Plan Note (Addendum)
Patient is in clinical remission off all medications. On one occasion he thought he saw worms in the stool in March. The patient is working again and has insurance. Since he is doing so well off medications, however,  I suggested that we hold off adding any medications and treat him expectantly. He was carefully instructed to send stool sample if he sees worms.

## 2012-09-09 NOTE — Progress Notes (Signed)
History of Present Illness:  The patient has returned for followup of Crohn's disease.  At his last visit 6 months ago he had symptoms including diarrhea with severe urgency which prevented him from working. He eventually lost his job. Colonoscopy demonstrated active disease at the anastomosis. He stopped Humira when he ran out of insurance and has been on no medications for several months. On one occasion in March he thought he saw worms in the stool.  He showed me a picture that suggested it could be a worm. For the past 3 months he has been symptom-free. Stools are solid. He is without pain. He's gained 15 pounds and his energy level is normal.    Review of Systems: Pertinent positive and negative review of systems were noted in the above HPI section. All other review of systems were otherwise negative.    Current Medications, Allergies, Past Medical History, Past Surgical History, Family History and Social History were reviewed in [facility] record  Vital signs were reviewed in today's medical record. Physical Exam: General: Well developed , well nourished, no acute distress

## 2012-10-10 ENCOUNTER — Telehealth: Payer: Self-pay | Admitting: *Deleted

## 2012-10-10 MED ORDER — HYOSCYAMINE SULFATE 0.125 MG PO TABS: 0.1250 mg | ORAL_TABLET | ORAL | Status: DC | PRN

## 2012-10-10 NOTE — Telephone Encounter (Signed)
Called patient to inform on Dr Guill[place] instructions

## 2012-10-10 NOTE — Telephone Encounter (Signed)
What does he need to do about his urgency after meals

## 2012-10-10 NOTE — Telephone Encounter (Signed)
Steel is having a little abdominal cramping and would like some hyosyamine called in  Per Dr [name] OK to send in
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
## 2012-10-10 NOTE — Telephone Encounter (Signed)
Ok

## 2012-10-10 NOTE — Telephone Encounter (Signed)
Hyoscyamine should help with the urgency. He can also add Imodium 1-2 tabs every 6 hours as needed provided that he does not develop bloating.

## 2012-11-12 ENCOUNTER — Telehealth: Payer: Self-pay | Admitting: Gastroenterology

## 2012-11-12 MED ORDER — HYOSCYAMINE SULFATE 0.125 MG PO TABS: 0.1250 mg | ORAL_TABLET | ORAL | Status: DC | PRN

## 2012-11-12 NOTE — Telephone Encounter (Signed)
Called pt to inform mmed sent to his pharmacy

## 2012-12-08 ENCOUNTER — Other Ambulatory Visit: Payer: Self-pay | Admitting: Gastroenterology

## 2013-01-13 ENCOUNTER — Encounter: Payer: Self-pay | Admitting: Gastroenterology

## 2013-01-13 ENCOUNTER — Ambulatory Visit (INDEPENDENT_AMBULATORY_CARE_PROVIDER_SITE_OTHER): Payer: BC Managed Care – PPO | Admitting: Gastroenterology

## 2013-01-13 VITALS — BP 120/70 | HR 79 | Ht 69.0 in | Wt 166.5 lb

## 2013-01-13 DIAGNOSIS — K50013 Crohn's disease of small intestine with fistula: Secondary | ICD-10-CM

## 2013-01-13 DIAGNOSIS — K5000 Crohn's disease of small intestine without complications: Secondary | ICD-10-CM

## 2013-01-13 NOTE — Progress Notes (Signed)
History of Present Illness:  The patient has returned for followup of Crohn's disease.  He's on no medications except for hyomax.  At least twice a week he is having episodes of severe diarrhea and abdominal discomfort.  He's had no bleeding.  Colonoscopy in January, 2013 demonstrated active disease at the anastomosis.  He thinks symptoms have improved with gluten-free diet.    Review of Systems: Pertinent positive and negative review of systems were noted in the above HPI section. All other review of systems were otherwise negative.    Current Medications, Allergies, Past Medical History, Past Surgical History, Family History and Social History were reviewed in [facility] record  Vital signs were reviewed in today's medical record. Physical Exam: General: Well developed , well nourished, no acute distress Skin: anicteric Head: Normocephalic and atraumatic Eyes:  sclerae anicteric, EOMI Ears: Normal auditory acuity Mouth: No deformity or lesions Lungs: Clear throughout to auscultation Heart: Regular rate and rhythm; no murmurs, rubs or bruits Abdomen: Soft, non tender and non distended. No masses, hepatosplenomegaly or hernias noted. Normal Bowel sounds Rectal:deferred Musculoskeletal: Symmetrical with no gross deformities  Pulses:  Normal pulses noted Extremities: No clubbing, cyanosis, edema or deformities noted Neurological: Alert oriented x 4, grossly nonfocal Psychological:  Alert and cooperative. Normal mood and affect

## 2013-01-13 NOTE — Patient Instructions (Signed)
We have given you a TB test today You will need to return in 48 hours to have the test read Follow up in 2 months We will contact you about Humera once TB test is negative

## 2013-01-13 NOTE — Assessment & Plan Note (Signed)
Patient clearly is symptomatic from active disease.  Recommendations #1 restarting Humira after repeating PPD

## 2013-01-15 LAB — TB SKIN TEST
Induration: 0 mm
TB Skin Test: NEGATIVE

## 2013-01-16 ENCOUNTER — Other Ambulatory Visit: Payer: Self-pay

## 2013-01-16 ENCOUNTER — Telehealth: Payer: Self-pay

## 2013-01-16 MED ORDER — ADALIMUMAB 40 MG/0.8ML ~~LOC~~ KIT: 40.0000 mg | PACK | Freq: Once | SUBCUTANEOUS | Status: DC

## 2013-01-16 MED ORDER — ADALIMUMAB 40 MG/0.8ML ~~LOC~~ KIT: PACK | SUBCUTANEOUS | Status: DC

## 2013-01-16 NOTE — Telephone Encounter (Signed)
Scripts sent to pharmacy and pt aware.

## 2013-01-17 ENCOUNTER — Telehealth: Payer: Self-pay | Admitting: Gastroenterology

## 2013-01-17 MED ORDER — HYOSCYAMINE SULFATE 0.125 MG PO TABS: 0.1250 mg | ORAL_TABLET | ORAL | Status: DC | PRN

## 2013-01-17 NOTE — Telephone Encounter (Signed)
Med sent.

## 2013-01-21 ENCOUNTER — Telehealth: Payer: Self-pay | Admitting: Gastroenterology

## 2013-01-21 NOTE — Telephone Encounter (Signed)
Scripts for [employer] and maintenance kit called to [facility].

## 2013-01-23 ENCOUNTER — Telehealth: Payer: Self-pay | Admitting: Gastroenterology

## 2013-01-24 NOTE — Telephone Encounter (Signed)
Prior auth forms faxed.

## 2013-02-06 NOTE — Consult Note (Signed)
Delayed cosign due to epic problem.    Saw agree with above.

## 2013-03-20 ENCOUNTER — Other Ambulatory Visit: Payer: Self-pay | Admitting: Gastroenterology

## 2013-05-21 ENCOUNTER — Telehealth: Payer: Self-pay | Admitting: *Deleted

## 2013-05-21 NOTE — Telephone Encounter (Signed)
Left message for pt to call back.  Pt states the that he has started to have a nervous/jittery-motion sickness feeling. Pt is due to take his Humira tomorrow and states that last time he was on Humira he had the same symptom. Tried to get pt to come in Friday to see Dr. [name] but pt states he has no insurance until 06/18/13. Please advise.

## 2013-05-22 NOTE — Telephone Encounter (Signed)
Does he get this feeling soon after the injection  or does he have it all the time for the 2 weeks before injections?

## 2013-05-22 NOTE — Telephone Encounter (Signed)
Pt states that this feeling does not happen right after the injection, states it kind of builds up. Pt did not take the shot last night and today he states he feels fine.

## 2013-05-23 NOTE — Telephone Encounter (Signed)
Spoke with pt and he is aware, pt instructed to call us with update after he takes the injection.

## 2013-05-23 NOTE — Telephone Encounter (Signed)
I think he should take the injection again and see whether symptoms recur.  If so we'll have to change his therapy

## 2013-07-10 ENCOUNTER — Telehealth: Payer: Self-pay | Admitting: *Deleted

## 2013-07-10 NOTE — Telephone Encounter (Signed)
You can prescribe Xanax 1 mg to take about half an hour before Humira.  If this does not help then he needs to contact us.  You can give him 3 tablets

## 2013-07-10 NOTE — Telephone Encounter (Signed)
Dr [name], Patient states that the Xanax will not help because he does not have the side effects till a few days after the injection

## 2013-07-10 NOTE — Telephone Encounter (Signed)
Dr [name],  Patient messaged me and want to know what he should do about his Humeria   He stopped taking it. It caused his heart to race and messed with his nerves   What should he take now ot should he cont the [facility]

## 2013-07-11 NOTE — Telephone Encounter (Signed)
We can try Remicade infusions in lieu of Humira

## 2013-07-11 NOTE — Telephone Encounter (Signed)
Dr [name], Patient does not want to do remicaid.......      Can we just prescribe him the Xanax to take before the [facility] and to take a few days after too

## 2013-07-12 NOTE — Telephone Encounter (Signed)
ok 

## 2013-07-14 MED ORDER — ALPRAZOLAM 1 MG PO TABS: ORAL_TABLET | ORAL | Status: DC

## 2013-07-14 NOTE — Telephone Encounter (Signed)
He should begin Xanax the day of the procedure and take it twice a day 1 mg tablets for 3-4 days.  You can prescribe 15 tablets

## 2013-07-14 NOTE — Telephone Encounter (Signed)
How many Xanax for this patient should I prescribe? He will need to use as needed with Humeria treatments

## 2013-07-15 NOTE — Telephone Encounter (Signed)
Pt aware script sent to pharmacy   Signed by Dr [name] not stamped

## 2013-11-04 ENCOUNTER — Telehealth: Payer: Self-pay | Admitting: *Deleted

## 2013-11-04 NOTE — Telephone Encounter (Signed)
Dr [name], Elenore Rota wants you to know he is having more diarrhea and abdominal pain. What should he do??

## 2013-11-05 NOTE — Telephone Encounter (Signed)
2 phones notes

## 2013-11-05 NOTE — Telephone Encounter (Signed)
Is he taking Humira? He should be evaluated with an office visit

## 2013-11-05 NOTE — Telephone Encounter (Signed)
Spoke with patient and he is in [place] and unable to come by 2pm. He is requesting to see Dr. [name].  I told him we will be back in touch about this.

## 2013-11-05 NOTE — Telephone Encounter (Signed)
Sorry Dr Carlean Purl, I just spoke with the patient he is working in [place] today, I didn't know he was working in [facility]    Dr [name] is going to try and come down stairs tomorrow from Endoscopy and see him around 4

## 2013-11-05 NOTE — Telephone Encounter (Signed)
Called patient to inform him that Dr [name] will see him tomorrow at 4:15pm

## 2013-11-05 NOTE — Telephone Encounter (Signed)
Dr Carlean Purl, You are [place]  This patient has possible Crohns flare. Needs to be seen but we have no openings and the Extenders are full. What should we do? Can you see this patient??

## 2013-11-05 NOTE — Telephone Encounter (Signed)
I can see him  Have him come in early (around 2 or so) and I will see him with andy the PA student  Work it out with PJ

## 2013-11-06 ENCOUNTER — Ambulatory Visit (INDEPENDENT_AMBULATORY_CARE_PROVIDER_SITE_OTHER): Payer: BC Managed Care – PPO | Admitting: Gastroenterology

## 2013-11-06 ENCOUNTER — Other Ambulatory Visit (INDEPENDENT_AMBULATORY_CARE_PROVIDER_SITE_OTHER): Payer: BC Managed Care – PPO

## 2013-11-06 ENCOUNTER — Encounter: Payer: Self-pay | Admitting: Gastroenterology

## 2013-11-06 VITALS — BP 110/76 | HR 66 | Ht 69.0 in | Wt 160.8 lb

## 2013-11-06 DIAGNOSIS — K5090 Crohn's disease, unspecified, without complications: Secondary | ICD-10-CM

## 2013-11-06 DIAGNOSIS — K50013 Crohn's disease of small intestine with fistula: Secondary | ICD-10-CM

## 2013-11-06 DIAGNOSIS — K5000 Crohn's disease of small intestine without complications: Secondary | ICD-10-CM

## 2013-11-06 LAB — CBC WITH DIFFERENTIAL/PLATELET
BASOS PCT: 0.2 % (ref 0.0–3.0)
Basophils Absolute: 0 10*3/uL (ref 0.0–0.1)
EOS PCT: 6.5 % — AB (ref 0.0–5.0)
Eosinophils Absolute: 0.5 10*3/uL (ref 0.0–0.7)
HCT: 46.9 % (ref 39.0–52.0)
Hemoglobin: 15.9 g/dL (ref 13.0–17.0)
LYMPHS PCT: 34.7 % (ref 12.0–46.0)
Lymphs Abs: 2.9 10*3/uL (ref 0.7–4.0)
MCHC: 33.9 g/dL (ref 30.0–36.0)
MCV: 84.3 fl (ref 78.0–100.0)
MONO ABS: 0.8 10*3/uL (ref 0.1–1.0)
MONOS PCT: 9.4 % (ref 3.0–12.0)
Neutro Abs: 4.1 10*3/uL (ref 1.4–7.7)
Neutrophils Relative %: 49.2 % (ref 43.0–77.0)
PLATELETS: 265 10*3/uL (ref 150.0–400.0)
RBC: 5.56 Mil/uL (ref 4.22–5.81)
RDW: 12.6 % (ref 11.5–15.5)
WBC: 8.4 10*3/uL (ref 4.0–10.5)

## 2013-11-06 LAB — COMPREHENSIVE METABOLIC PANEL
ALT: 40 U/L (ref 0–53)
AST: 25 U/L (ref 0–37)
Albumin: 4.3 g/dL (ref 3.5–5.2)
Alkaline Phosphatase: 60 U/L (ref 39–117)
BUN: 13 mg/dL (ref 6–23)
CALCIUM: 9.5 mg/dL (ref 8.4–10.5)
CHLORIDE: 102 meq/L (ref 96–112)
CO2: 30 meq/L (ref 19–32)
Creatinine, Ser: 1.2 mg/dL (ref 0.4–1.5)
GFR: 76.95 mL/min (ref >60.00)
Glucose, Bld: 105 mg/dL — ABNORMAL HIGH (ref 70–99)
Potassium: 3.6 mEq/L (ref 3.5–5.1)
SODIUM: 139 meq/L (ref 135–145)
TOTAL PROTEIN: 7.6 g/dL (ref 6.0–8.3)
Total Bilirubin: 0.8 mg/dL (ref 0.2–1.2)

## 2013-11-06 LAB — C-REACTIVE PROTEIN: CRP: 1.2 mg/dL (ref 0.5–20.0)

## 2013-11-06 LAB — SEDIMENTATION RATE: SED RATE: 2 mm/h (ref 0–22)

## 2013-11-06 NOTE — Patient Instructions (Signed)
Go to the basement for labs today   Your Follow up appointment is scheduled on 01/13/2014 at 3:45pm

## 2013-11-06 NOTE — Assessment & Plan Note (Signed)
Today history of transient abdominal pain with loose stools but no bleeding or fever.  Symptoms could reflect flareup of Crohn's although he seems to be improving spontaneously.  Recommendations #1 CBC, CRP, sedimentation rate and conference of metabolic profile #2 continue Humira #3 hold therapy with steroids unless patient's symptoms should worsen again, and pending evaluation of lab work

## 2013-11-06 NOTE — Progress Notes (Signed)
      History of Present Illness:  [name] has returned for evaluation of abdominal pain and diarrhea approximately 2-3 days ago he noted the development of loose stools.  2 days ago he developed severe sharp, crampy lower abdominal pain.  Pain has subsided and stools are still loose but decreasing in frequency.  He denies fever or bleeding.  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  He remains on Humira.    Review of Systems: Pertinent positive and negative review of systems were noted in the above HPI section. All other review of systems were otherwise negative.    Current Medications, Allergies, Past Medical History, Past Surgical History, Family History and Social History were reviewed in [facility] record  Vital signs were reviewed in today's medical record. Physical Exam: General: Well developed , well nourished, no acute distress Skin: anicteric Head: Normocephalic and atraumatic Eyes:  sclerae anicteric, EOMI Ears: Normal auditory acuity Mouth: No deformity or lesions Lungs: Clear throughout to auscultation Heart: Regular rate and rhythm; no murmurs, rubs or bruits Abdomen: Soft, non tender and non distended. No masses, hepatosplenomegaly or hernias noted. Normal Bowel sounds Rectal:deferred Musculoskeletal: Symmetrical with no gross deformities  Pulses:  Normal pulses noted Extremities: No clubbing, cyanosis, edema or deformities noted Neurological: Alert oriented x 4, grossly nonfocal Psychological:  Alert and cooperative. Normal mood and affect  See Assessment and Plan under Problem List

## 2013-11-10 ENCOUNTER — Other Ambulatory Visit: Payer: Self-pay | Admitting: Gastroenterology

## 2013-11-10 ENCOUNTER — Telehealth: Payer: Self-pay | Admitting: *Deleted

## 2013-11-10 MED ORDER — PREDNISONE 20 MG PO TABS: 20.0000 mg | ORAL_TABLET | Freq: Every day | ORAL | Status: DC

## 2013-11-10 NOTE — Telephone Encounter (Signed)
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

## 2013-11-10 NOTE — Telephone Encounter (Signed)
Called patient to inform that we would send in Prednisone to his pharmacy to start taking today and to call back on thursday

## 2013-11-10 NOTE — Telephone Encounter (Signed)
Begin prednisone 20 mg daily.  Call back on Thursday

## 2013-11-25 IMAGING — CT CT ABD-PELV W/ CM
2 of 4 series · 17 of 46 positions shown, 19 images · IV contrast (Omnipaque 300)
Comparison: CT scan 02/05/2011.

CLINICAL DATA: New diagnosis of Crohn's disease.  Abdominal pain
and bloating.

CT ABDOMEN AND PELVIS WITH CONTRAST
TECHNIQUE: Multidetector CT imaging of the abdomen and pelvis was
performed following the standard protocol during bolus
administration of intravenous contrast.
Contrast: 100mL OMNIPAQUE IOHEXOL 300 MG/ML IV SOLN

[Series 2: abd/ pel 5mm · axial · 0.64mm/px · z∈[-393,+17]mm · 14 of 90 slices shown, 16 images]
[im 4/90  soft-tissue]
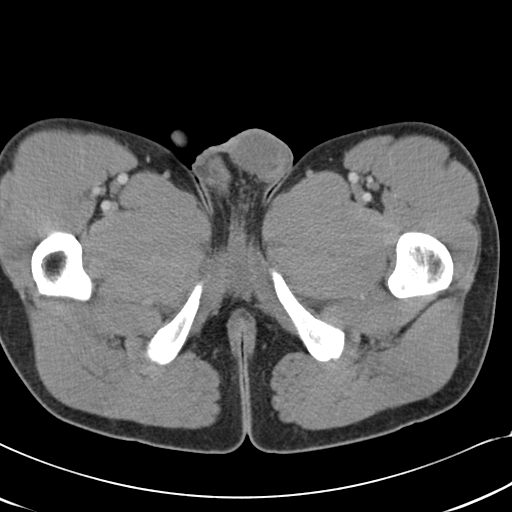
[im 4/90  bone]
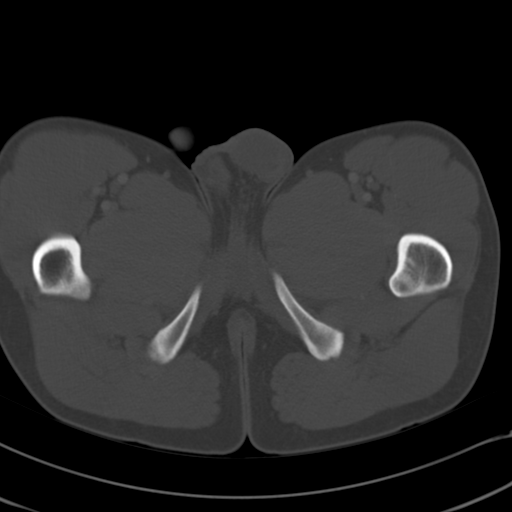
[im 11/90  soft-tissue]
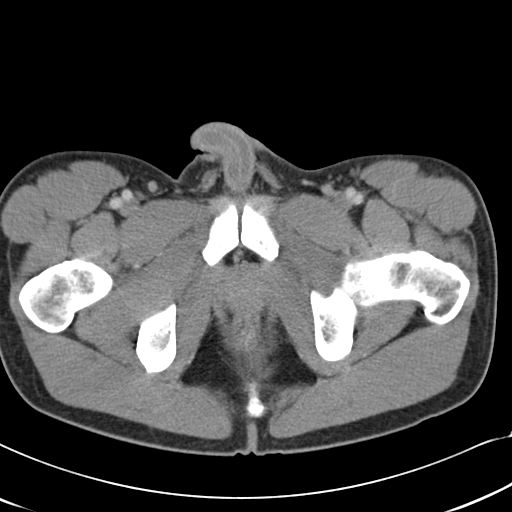
[im 18/90  soft-tissue]
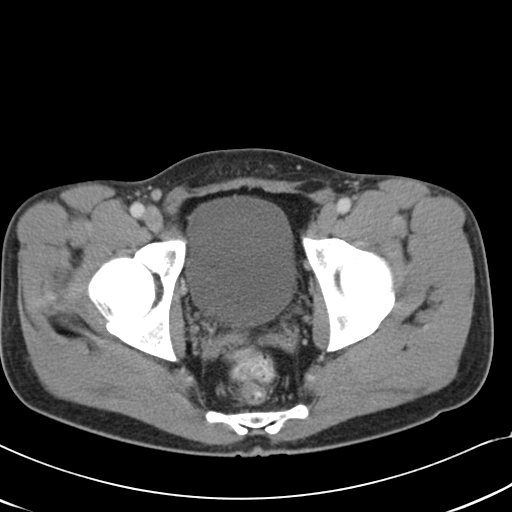
[im 25/90  soft-tissue]
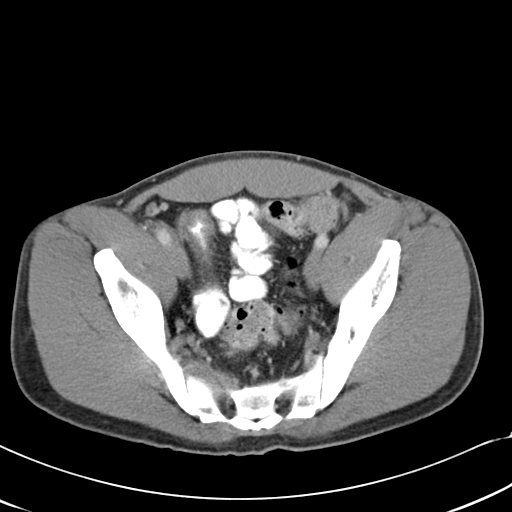
[im 29/90  soft-tissue]
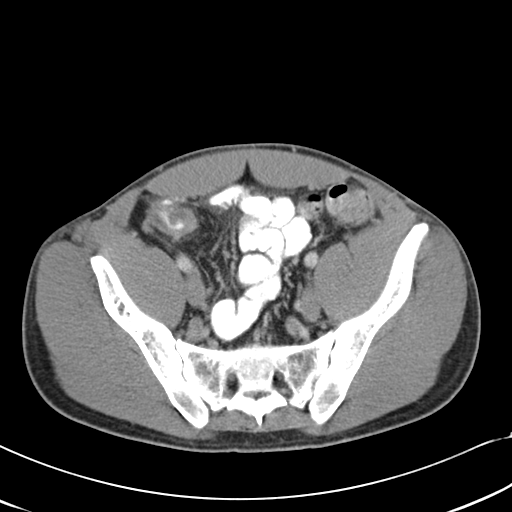
[im 36/90  soft-tissue]
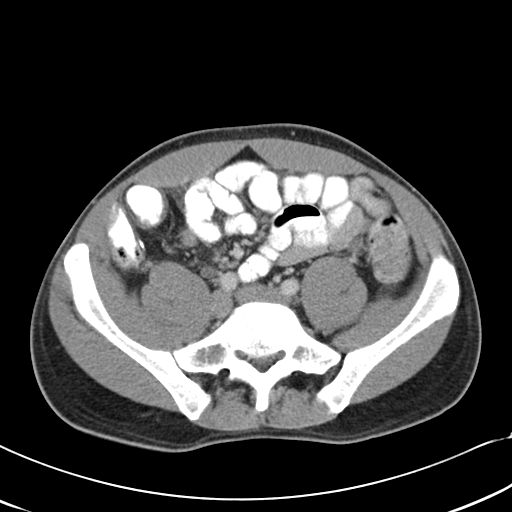
[im 43/90  soft-tissue]
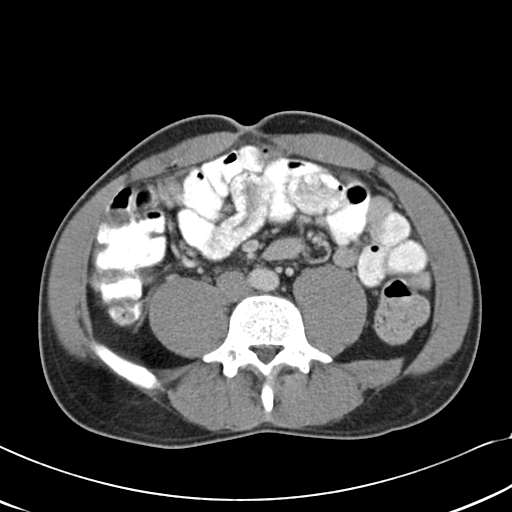
[im 47/90  soft-tissue]
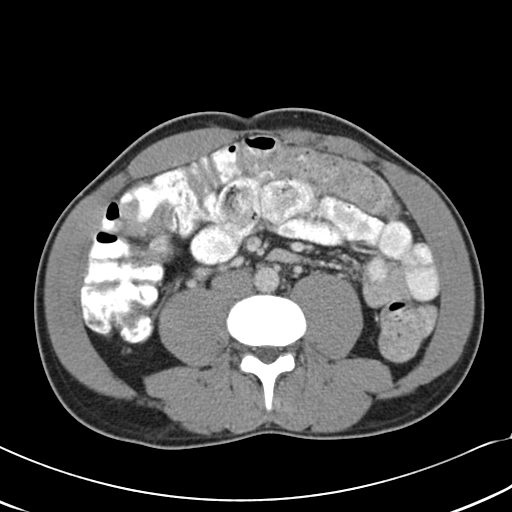
[im 54/90  soft-tissue]
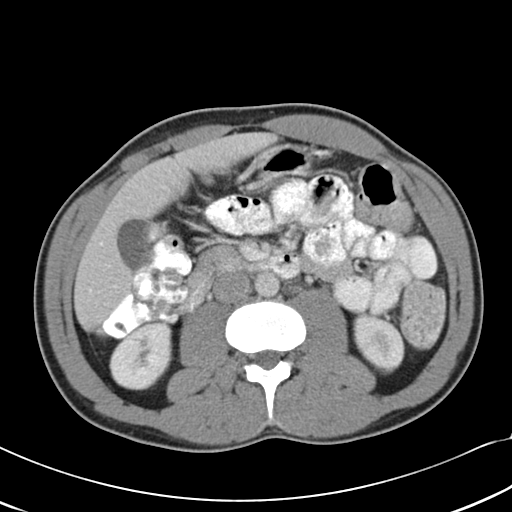
[im 54/90  bone]
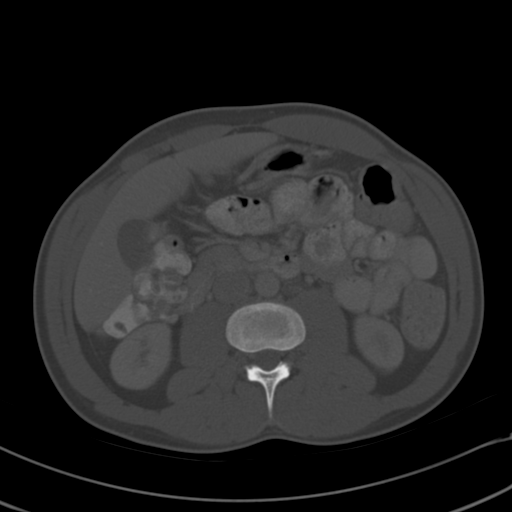
[im 61/90  soft-tissue]
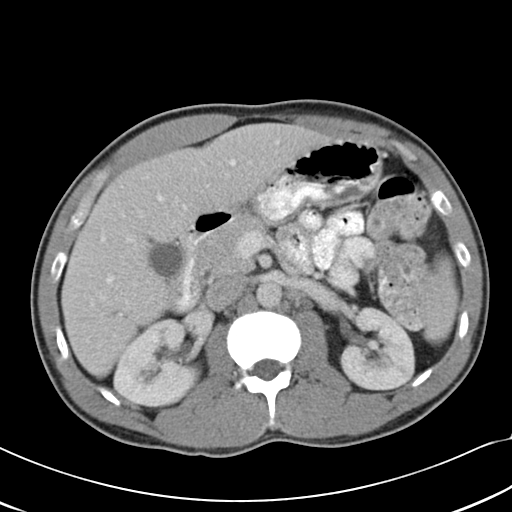
[im 68/90  soft-tissue]
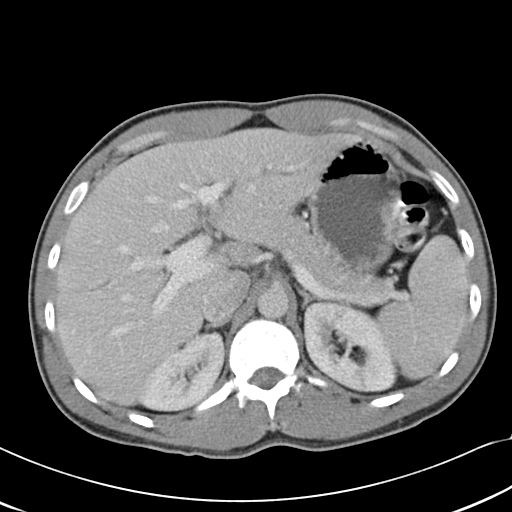
[im 72/90  soft-tissue]
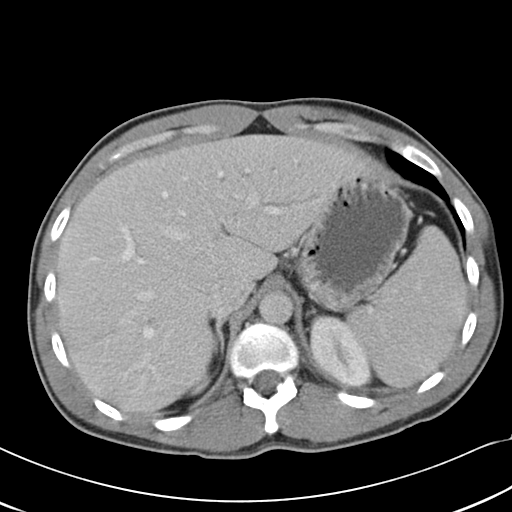
[im 79/90  soft-tissue]
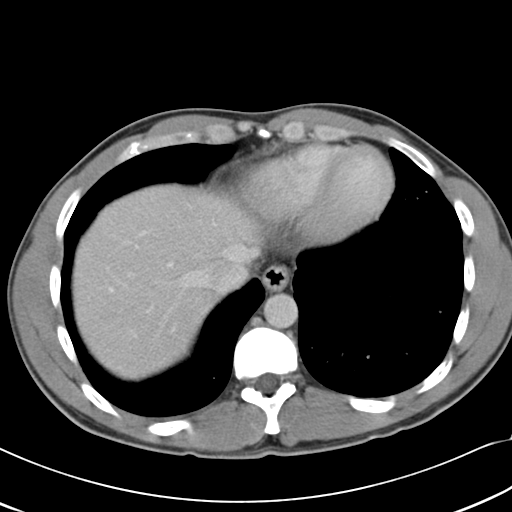
[im 86/90  soft-tissue]
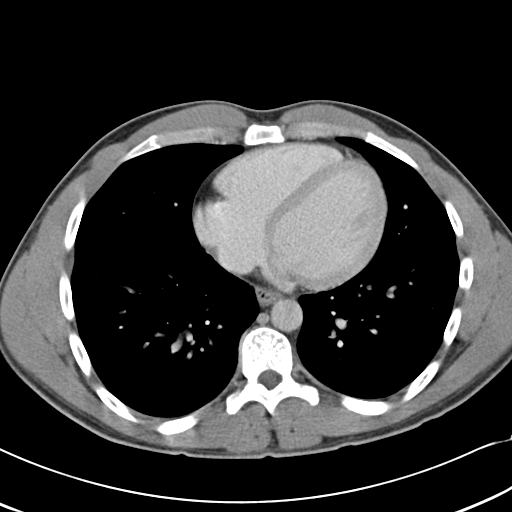

[Series 602: cor · coronal · 0.90mm/px · 3 of 101 slices shown]
[im 34/101  soft-tissue]
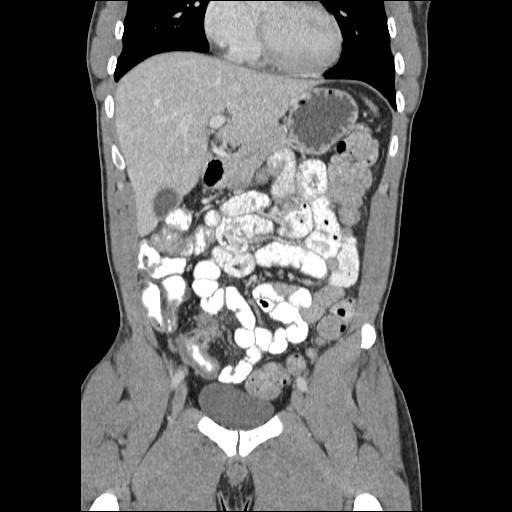
[im 45/101  soft-tissue]
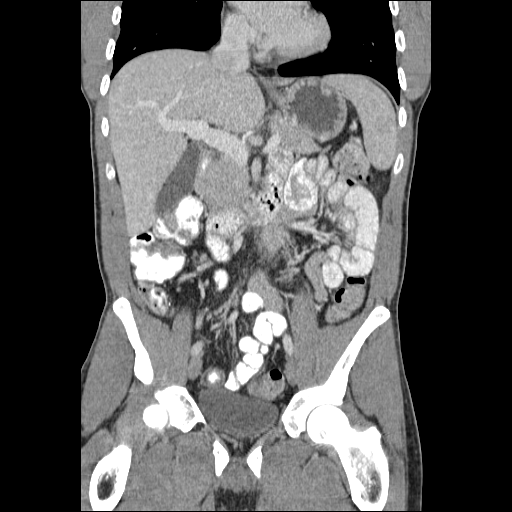
[im 56/101  soft-tissue]
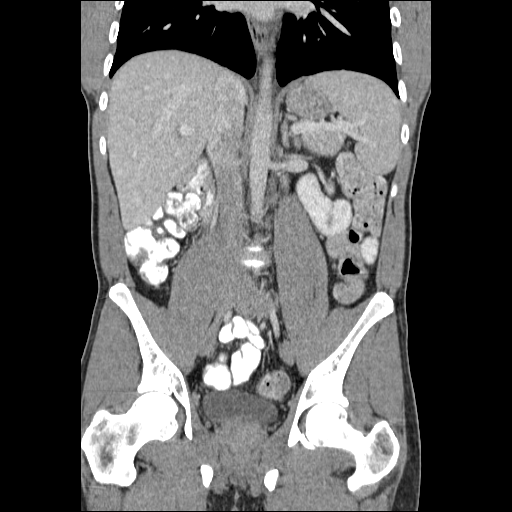

[17 of 46 positions shown; findings below may reference images not displayed]

FINDINGS: The lung bases are clear.

The solid abdominal organs are unremarkable and stable. Small lower
pole right renal calculi are noted.

The stomach, duodenum and colon are unremarkable and stable.
Examination of the small bowel demonstrates persistent fairly
marked wall thickening and inflammatory change involving the distal
ileum.  There is an enteromesenteric fistula with a persistent
inflammatory mass in the adjacent small bowel mesentery with
enlarged lymph nodes and a small amount of leaking oral contrast.
No discrete drainable abscess.

No free pelvic fluid collections.  The bladder, prostate gland and
seminal vesicles are unremarkable.  The aorta and branch vessels
are normal and stable.  Stable small scattered mesenteric and
retroperitoneal lymph nodes.

The bony structures are intact and stable.
IMPRESSION: 1. Persistent marked inflammatory change involving the distal ileum
consistent with Crohn's disease.  There is also a persistent
enteromesenteric fistula and inflammatory mesenteric mass but no
discrete drainable abscess.
2.  Stable scattered mesenteric and retroperitoneal lymph nodes but
no adenopathy.

## 2013-11-26 ENCOUNTER — Telehealth: Payer: Self-pay | Admitting: *Deleted

## 2013-11-26 ENCOUNTER — Other Ambulatory Visit: Payer: Self-pay | Admitting: Gastroenterology

## 2013-11-26 DIAGNOSIS — R197 Diarrhea, unspecified: Secondary | ICD-10-CM

## 2013-11-26 NOTE — Telephone Encounter (Signed)
Dr [name], Patient called stated that he is still having a lot of diarrhea but not much cramping  He has completed his prednisone. What is next?

## 2013-11-26 NOTE — Telephone Encounter (Signed)
Please order a fecal captrotectin and stool pathogen panel.  If negative he will require colonoscopy.

## 2013-11-26 NOTE — Telephone Encounter (Signed)
L/M for pt to have stool test done the orders are in at the lab

## 2013-11-28 ENCOUNTER — Other Ambulatory Visit: Payer: Self-pay | Admitting: *Deleted

## 2013-11-28 ENCOUNTER — Ambulatory Visit: Payer: BC Managed Care – PPO

## 2013-11-28 DIAGNOSIS — R197 Diarrhea, unspecified: Secondary | ICD-10-CM

## 2013-11-28 NOTE — Telephone Encounter (Signed)
Beth, I will not be here next week. Im going to send this to you. Im waiting for him to do some stool test Thanks

## 2013-12-01 ENCOUNTER — Telehealth: Payer: Self-pay | Admitting: Gastroenterology

## 2013-12-02 ENCOUNTER — Telehealth: Payer: Self-pay

## 2013-12-02 ENCOUNTER — Other Ambulatory Visit: Payer: Self-pay

## 2013-12-02 DIAGNOSIS — R198 Other specified symptoms and signs involving the digestive system and abdomen: Secondary | ICD-10-CM

## 2013-12-02 MED ORDER — ADALIMUMAB 40 MG/0.8ML ~~LOC~~ AJKT: 40.0000 mg | AUTO-INJECTOR | Freq: Once | SUBCUTANEOUS | Status: DC

## 2013-12-02 NOTE — Telephone Encounter (Signed)
Faxed refill form for Humira to specialty pharmacy

## 2013-12-02 NOTE — Telephone Encounter (Signed)
Patient submitted specimen. Lab calls. The patient will be contacted by lab to bring a new specimen. Lab error in processing.

## 2013-12-05 ENCOUNTER — Ambulatory Visit (INDEPENDENT_AMBULATORY_CARE_PROVIDER_SITE_OTHER)
Admission: RE | Admit: 2013-12-05 | Discharge: 2013-12-05 | Disposition: A | Discharge status: Home / Self Care | Payer: BC Managed Care – PPO | Source: Ambulatory Visit | Attending: Gastroenterology | Admitting: Gastroenterology

## 2013-12-05 ENCOUNTER — Ambulatory Visit (INDEPENDENT_AMBULATORY_CARE_PROVIDER_SITE_OTHER): Payer: BC Managed Care – PPO | Admitting: Gastroenterology

## 2013-12-05 ENCOUNTER — Encounter: Payer: Self-pay | Admitting: Gastroenterology

## 2013-12-05 ENCOUNTER — Telehealth: Payer: Self-pay | Admitting: Gastroenterology

## 2013-12-05 VITALS — BP 104/60 | HR 72 | Temp 98.1°F | Ht 67.75 in | Wt 160.5 lb

## 2013-12-05 DIAGNOSIS — K5000 Crohn's disease of small intestine without complications: Secondary | ICD-10-CM

## 2013-12-05 DIAGNOSIS — R109 Unspecified abdominal pain: Secondary | ICD-10-CM

## 2013-12-05 DIAGNOSIS — K50013 Crohn's disease of small intestine with fistula: Secondary | ICD-10-CM

## 2013-12-05 MED ORDER — PREDNISONE 10 MG PO TABS: ORAL_TABLET | ORAL | Status: DC

## 2013-12-05 MED ORDER — OXYCODONE-ACETAMINOPHEN 10-325 MG PO TABS: 1.0000 | ORAL_TABLET | Freq: Four times a day (QID) | ORAL | Status: DC | PRN

## 2013-12-05 MED ORDER — IOHEXOL 300 MG/ML  SOLN
100.0000 mL | Freq: Once | INTRAMUSCULAR | Status: AC | PRN
  Administered 2013-12-05: 100 mL via INTRAVENOUS

## 2013-12-05 MED ORDER — ADALIMUMAB 40 MG/0.8ML ~~LOC~~ AJKT: 40.0000 mg | AUTO-INJECTOR | Freq: Once | SUBCUTANEOUS | Status: DC

## 2013-12-05 NOTE — Assessment & Plan Note (Addendum)
Patient is having symptoms of active Crohn's disease despite therapy with Humira.  Recommendations #1 begin prednisone 40 mg daily #2 CT of the abdomen and pelvis #3 check adalimumab levels and antibody levels #4 Percocet prescribed #5 increase Humira to weekly

## 2013-12-05 NOTE — Patient Instructions (Signed)
Please come in to the lab to have your labs drawn   You have been scheduled for a CT scan of the abdomen and pelvis at [facility] ([address]---this is in the same building as [facility]).   You are scheduled on 12/05/2013 at 3:30pm. You should arrive 15 minutes prior to your appointment time for registration. Please follow the written instructions below on the day of your exam:  WARNING: IF YOU ARE ALLERGIC TO IODINE/X-RAY DYE, PLEASE NOTIFY RADIOLOGY IMMEDIATELY AT [phone]! YOU WILL BE GIVEN A 13 HOUR PREMEDICATION PREP.  1) Do not eat or drink anything after 11:30am (4 hours prior to your test) 2) You have been given 2 bottles of oral contrast to drink. The solution may taste better if refrigerated, but do NOT add ice or any other liquid to this solution. Shake well before drinking.    Drink 1 bottle of contrast @ 1;30pm (2 hours prior to your exam)  Drink 1 bottle of contrast @ 2:30pm (1 hour prior to your exam)  You may take any medications as prescribed with a small amount of water except for the following: Metformin, Glucophage, Glucovance, Avandamet, Riomet, Fortamet, Actoplus Met, Janumet, Glumetza or Metaglip. The above medications must be held the day of the exam AND 48 hours after the exam.  The purpose of you drinking the oral contrast is to aid in the visualization of your intestinal tract. The contrast solution may cause some diarrhea. Before your exam is started, you will be given a small amount of fluid to drink. Depending on your individual set of symptoms, you may also receive an intravenous injection of x-ray contrast/dye. Plan on being at [facility] for 30 minutes or long, depending on the type of exam you are having performed.  If you have any questions regarding your exam or if you need to reschedule, you may call the CT department at [phone] between the hours of 8:00 am and 5:00 pm, Monday-Friday.  Please call are office on  Monday to let Dr. [name] know how you are doing.    ________________________________________________________________________

## 2013-12-05 NOTE — Telephone Encounter (Signed)
Spoke with patient.He has started having pain in the RLQ. He previously had only diarrhea which improved minimally on prednisone. Pain became unbearable last night. Reports he stayed in the bed from 5 pm until 5 am. He did not eat, felt nauseous and had chills. He reports tenderness and recreation of the pain if he pushes on his abdomen. He has not vomited. He has had 2-3 diarrhea stools this morning.  Because the symptoms have changed and he is now in pain, an appointment is made today for re-evaluation.

## 2013-12-05 NOTE — Assessment & Plan Note (Signed)
Worsening abdominal pain probably reflects a flareup.  He does not have obstructive symptoms.    Recommendations #1 CT of the abdomen and pelvis

## 2013-12-05 NOTE — Progress Notes (Signed)
      History of Present Illness:  Mr. Sowash has returned because of abdominal pain.  Approximately a month ago he was started on a short course of steroids because of abdominal pain.  Symptoms subsided.  Over the past 2 days she's had severe lower abdominal pain.  He has had diarrhea all along although it has not been bloody.  He began Humira approximately 6 months ago.    Review of Systems: Pertinent positive and negative review of systems were noted in the above HPI section. All other review of systems were otherwise negative.    Current Medications, Allergies, Past Medical History, Past Surgical History, Family History and Social History were reviewed in [facility] record  Vital signs were reviewed in today's medical record. Physical Exam: General: Well developed , well nourished, uncomfortable appearing Skin: anicteric Head: Normocephalic and atraumatic Eyes:  sclerae anicteric, EOMI Ears: Normal auditory acuity Mouth: No deformity or lesions Lungs: Clear throughout to auscultation Heart: Regular rate and rhythm; no murmurs, rubs or bruits Abdomen: Soft,  and non distended. No masses, hepatosplenomegaly or hernias noted. Normal Bowel sounds.  There is moderate tenderness in the periumbilical and lower quadrants bilaterally without guarding or rebound Rectal:deferred Musculoskeletal: Symmetrical with no gross deformities  Pulses:  Normal pulses noted Extremities: No clubbing, cyanosis, edema or deformities noted Neurological: Alert oriented x 4, grossly nonfocal Psychological:  Alert and cooperative. Normal mood and affect  See Assessment and Plan under Problem List

## 2013-12-08 ENCOUNTER — Other Ambulatory Visit: Payer: BC Managed Care – PPO

## 2013-12-08 ENCOUNTER — Telehealth: Payer: Self-pay | Admitting: *Deleted

## 2013-12-08 DIAGNOSIS — R109 Unspecified abdominal pain: Secondary | ICD-10-CM

## 2013-12-08 NOTE — Telephone Encounter (Signed)
Dr [name], Patient wants call back on CT results

## 2013-12-08 NOTE — Telephone Encounter (Signed)
The CT scan shows changes consistent with active Crohn's disease.  No surprises.  Please inquire whether he's feeling better on the prednisone?

## 2013-12-09 ENCOUNTER — Other Ambulatory Visit: Payer: BC Managed Care – PPO

## 2013-12-09 DIAGNOSIS — R198 Other specified symptoms and signs involving the digestive system and abdomen: Secondary | ICD-10-CM

## 2013-12-09 NOTE — Telephone Encounter (Signed)
Informed patient

## 2013-12-09 NOTE — Telephone Encounter (Signed)
He also wants to know if he has any damage ? Like "holes" not sure what he means

## 2013-12-09 NOTE — Telephone Encounter (Signed)
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 Says he has been taking 3 a day but it looks like he should be taking 4 per day  Which is correct

## 2013-12-09 NOTE — Telephone Encounter (Signed)
He should be taking for 10 mg tablets once daily.  No he has no fistula or abnormal connections like he had previously

## 2013-12-10 ENCOUNTER — Telehealth: Payer: Self-pay | Admitting: Gastroenterology

## 2013-12-10 LAB — CLOSTRIDIUM DIFFICILE BY PCR: CDIFFPCR: NOT DETECTED

## 2013-12-10 NOTE — Telephone Encounter (Signed)
Prescription verified as being increased to weekly injections. See medication list and office note of 12/05/13

## 2013-12-15 LAB — ADALIMUMAB+AB (SERIAL MONITOR)
Adalimumab Drug Level: 3.7 ug/mL
Anti-Adalimumab Antibody: <25 ng/mL

## 2013-12-19 ENCOUNTER — Other Ambulatory Visit: Payer: Self-pay

## 2013-12-19 ENCOUNTER — Telehealth: Payer: Self-pay | Admitting: *Deleted

## 2013-12-19 MED ORDER — ADALIMUMAB 40 MG/0.8ML ~~LOC~~ AJKT: 40.0000 mg | AUTO-INJECTOR | Freq: Once | SUBCUTANEOUS | Status: DC

## 2013-12-19 NOTE — Telephone Encounter (Signed)
Beth, Patient contacted me and has questions about his Humeria

## 2013-12-19 NOTE — Telephone Encounter (Signed)
Patient states he was told the Humira required a new PA. [employer] Rx form completed with the once weekly dosing and faxed with records, labs and insurane card. Patient states he is otherwise doing well.

## 2013-12-24 ENCOUNTER — Other Ambulatory Visit: Payer: Self-pay | Admitting: Gastroenterology

## 2013-12-29 ENCOUNTER — Other Ambulatory Visit: Payer: Self-pay

## 2013-12-29 ENCOUNTER — Telehealth: Payer: Self-pay | Admitting: Gastroenterology

## 2013-12-29 MED ORDER — ADALIMUMAB 40 MG/0.8ML ~~LOC~~ AJKT: 40.0000 mg | AUTO-INJECTOR | Freq: Once | SUBCUTANEOUS | Status: DC

## 2013-12-29 NOTE — Telephone Encounter (Signed)
i called to [facility]. Humira is approved. They will be contacting the patient soon for delivery. Patient notified.

## 2014-01-13 ENCOUNTER — Ambulatory Visit: Payer: BC Managed Care – PPO | Admitting: Gastroenterology

## 2014-02-04 ENCOUNTER — Other Ambulatory Visit: Payer: Self-pay | Admitting: Gastroenterology

## 2014-02-04 NOTE — Telephone Encounter (Signed)
Will you refill for patient?

## 2014-04-21 ENCOUNTER — Other Ambulatory Visit: Payer: Self-pay | Admitting: Gastroenterology

## 2014-05-03 ENCOUNTER — Ambulatory Visit (INDEPENDENT_AMBULATORY_CARE_PROVIDER_SITE_OTHER): Payer: BLUE CROSS/BLUE SHIELD | Admitting: Physician Assistant

## 2014-05-03 VITALS — BP 116/70 | HR 100 | Temp 97.7°F | Resp 16 | Ht 68.25 in | Wt 168.0 lb

## 2014-05-03 DIAGNOSIS — T1592XA Foreign body on external eye, part unspecified, left eye, initial encounter: Secondary | ICD-10-CM

## 2014-05-03 DIAGNOSIS — S0502XA Injury of conjunctiva and corneal abrasion without foreign body, left eye, initial encounter: Secondary | ICD-10-CM

## 2014-05-03 DIAGNOSIS — Z23 Encounter for immunization: Secondary | ICD-10-CM

## 2014-05-03 MED ORDER — OFLOXACIN 0.3 % OP SOLN: 2.0000 [drp] | OPHTHALMIC | Status: DC

## 2014-05-03 NOTE — Addendum Note (Signed)
Addended by: Wardell Honour on: 05/03/2014 09:31 AM   Modules accepted: Level of Service

## 2014-05-03 NOTE — Progress Notes (Signed)
History and physical examinations obtained with Tishira Brewington, PA-C.  Agree with assessment and plan.  Follow-up 24-48 hours with Windell Hummingbird, PA-C.    S/p TDAP.

## 2014-05-03 NOTE — Patient Instructions (Signed)
Corneal Abrasion The cornea is the clear covering at the front and center of the eye. When looking at the colored portion of the eye (iris), you are looking through the cornea. This very thin tissue is made up of many layers. The surface layer is a single layer of cells (corneal epithelium) and is one of the most sensitive tissues in the body. If a scratch or injury causes the corneal epithelium to come off, it is called a corneal abrasion. If the injury extends to the tissues below the epithelium, the condition is called a corneal ulcer. CAUSES   Scratches.  Trauma.  Foreign body in the eye. Some people have recurrences of abrasions in the area of the original injury even after it has healed (recurrent erosion syndrome). Recurrent erosion syndrome generally improves and goes away with time. SYMPTOMS   Eye pain.  Difficulty or inability to keep the injured eye open.  The eye becomes very sensitive to light.  Recurrent erosions tend to happen suddenly, first thing in the morning, usually after waking up and opening the eye. DIAGNOSIS  Your health care provider can diagnose a corneal abrasion during an eye exam. Dye is usually placed in the eye using a drop or a small paper strip moistened by your tears. When the eye is examined with a special light, the abrasion shows up clearly because of the dye. TREATMENT   Small abrasions may be treated with antibiotic drops or ointment alone.  A pressure patch may be put over the eye. If this is done, follow your doctor's instructions for when to remove the patch. Do not drive or use machines while the eye patch is on. Judging distances is hard to do with a patch on. If the abrasion becomes infected and spreads to the deeper tissues of the cornea, a corneal ulcer can result. This is serious because it can cause corneal scarring. Corneal scars interfere with light passing through the cornea and cause a loss of vision in the involved eye. HOME CARE  INSTRUCTIONS  Use medicine or ointment as directed. Only take over-the-counter or prescription medicines for pain, discomfort, or fever as directed by your health care provider.  Do not drive or operate machinery if your eye is patched. Your ability to judge distances is impaired.  If your health care provider has given you a follow-up appointment, it is very important to keep that appointment. Not keeping the appointment could result in a severe eye infection or permanent loss of vision. If there is any problem keeping the appointment, let your health care provider know. SEEK MEDICAL CARE IF:   You have pain, light sensitivity, and a scratchy feeling in one eye or both eyes.  Your pressure patch keeps loosening up, and you can blink your eye under the patch after treatment.  Any kind of discharge develops from the eye after treatment or if the lids stick together in the morning.  You have the same symptoms in the morning as you did with the original abrasion days, weeks, or months after the abrasion healed. MAKE SURE YOU:   Understand these instructions.  Will watch your condition.  Will get help right away if you are not doing well or get worse. Document Released: 03/03/2000 Document Revised: 03/11/2013 Document Reviewed: 11/11/2012 [facility] Patient Information 2015 [place], [place]. This information is not intended to replace advice given to you by your health care provider. Make sure you discuss any questions you have with your health care provider.  Tdap Vaccine (  Tetanus, Diphtheria, Pertussis): What You Need to Know 1. Why get vaccinated? Tetanus, diphtheria and pertussis can be very serious diseases, even for adolescents and adults. Tdap vaccine can protect [place] from these diseases. TETANUS (Lockjaw) causes painful muscle tightening and stiffness, usually all over the body.  It can lead to tightening of muscles in the head and neck so you can't open your mouth, swallow, or  sometimes even breathe. Tetanus kills about 1 out of 5 people who are infected. DIPHTHERIA can cause a thick coating to form in the back of the throat.  It can lead to breathing problems, paralysis, heart failure, and death. PERTUSSIS (Whooping Cough) causes severe coughing spells, which can cause difficulty breathing, vomiting and disturbed sleep.  It can also lead to weight loss, incontinence, and rib fractures. Up to 2 in 100 adolescents and 5 in 100 adults with pertussis are hospitalized or have complications, which could include pneumonia or death. These diseases are caused by bacteria. Diphtheria and pertussis are spread from person to person through coughing or sneezing. Tetanus enters the body through cuts, scratches, or wounds. Before vaccines, the [place] saw as many as 200,000 cases a year of diphtheria and pertussis, and hundreds of cases of tetanus. Since vaccination began, tetanus and diphtheria have dropped by about 99% and pertussis by about 80%. 2. Tdap vaccine Tdap vaccine can protect adolescents and adults from tetanus, diphtheria, and pertussis. One dose of Tdap is routinely given at age 2 or 74. People who did not get Tdap at that age should get it as soon as possible. Tdap is especially important for health care professionals and anyone having close contact with a baby younger than 12 months. Pregnant women should get a dose of Tdap during every pregnancy, to protect the newborn from pertussis. Infants are most at risk for severe, life-threatening complications from pertussis. A similar vaccine, called Td, protects from tetanus and diphtheria, but not pertussis. A Td booster should be given every 10 years. Tdap may be given as one of these boosters if you have not already gotten a dose. Tdap may also be given after a severe cut or burn to prevent tetanus infection. Your doctor can give you more information. Tdap may safely be given at the same time as other vaccines. 3.  Some people should not get this vaccine  If you ever had a life-threatening allergic reaction after a dose of any tetanus, diphtheria, or pertussis containing vaccine, OR if you have a severe allergy to any part of this vaccine, you should not get Tdap. Tell your doctor if you have any severe allergies.  If you had a coma, or long or multiple seizures within 7 days after a childhood dose of DTP or DTaP, you should not get Tdap, unless a cause other than the vaccine was found. You can still get Td.  Talk to your doctor if you:  have epilepsy or another nervous system problem,  had severe pain or swelling after any vaccine containing diphtheria, tetanus or pertussis,  ever had Guillain-Barr Syndrome (GBS),  aren't feeling well on the day the shot is scheduled. 4. Risks of a vaccine reaction With any medicine, including vaccines, there is a chance of side effects. These are usually mild and go away on their own, but serious reactions are also possible. Brief fainting spells can follow a vaccination, leading to injuries from falling. Sitting or lying down for about 15 minutes can help prevent these. Tell your doctor if you feel dizzy  or light-headed, or have vision changes or ringing in the ears. Mild problems following Tdap (Did not interfere with activities)  Pain where the shot was given (about 3 in 4 adolescents or 2 in 3 adults)  Redness or swelling where the shot was given (about 1 person in 5)  Mild fever of at least 100.16F (up to about 1 in 25 adolescents or 1 in 100 adults)  Headache (about 3 or 4 people in 10)  Tiredness (about 1 person in 3 or 4)  Nausea, vomiting, diarrhea, stomach ache (up to 1 in 4 adolescents or 1 in 10 adults)  Chills, body aches, sore joints, rash, swollen glands (uncommon) Moderate problems following Tdap (Interfered with activities, but did not require medical attention)  Pain where the shot was given (about 1 in 5 adolescents or 1 in 100  adults)  Redness or swelling where the shot was given (up to about 1 in 16 adolescents or 1 in 25 adults)  Fever over 102F (about 1 in 100 adolescents or 1 in 250 adults)  Headache (about 3 in 20 adolescents or 1 in 10 adults)  Nausea, vomiting, diarrhea, stomach ache (up to 1 or 3 people in 100)  Swelling of the entire arm where the shot was given (up to about 3 in 100). Severe problems following Tdap (Unable to perform usual activities; required medical attention)  Swelling, severe pain, bleeding and redness in the arm where the shot was given (rare). A severe allergic reaction could occur after any vaccine (estimated less than 1 in a million doses). 5. What if there is a serious reaction? What should I look for?  Look for anything that concerns you, such as signs of a severe allergic reaction, very high fever, or behavior changes. Signs of a severe allergic reaction can include hives, swelling of the face and throat, difficulty breathing, a fast heartbeat, dizziness, and weakness. These would start a few minutes to a few hours after the vaccination. What should I do?  If you think it is a severe allergic reaction or other emergency that can't wait, call 9-1-1 or get the person to the nearest hospital. Otherwise, call your doctor.  Afterward, the reaction should be reported to the "Vaccine Adverse Event Reporting System" (VAERS). Your doctor might file this report, or you can do it yourself through the VAERS web site at www.vaers.SamedayNews.es, or by calling 541-511-8842. VAERS is only for reporting reactions. They do not give medical advice.  6. The National Vaccine Injury Compensation Program The [employer] Vaccine Injury Compensation Program (VICP) is a federal program that was created to compensate people who may have been injured by certain vaccines. Persons who believe they may have been injured by a vaccine can learn about the program and about filing a claim by calling  847-025-2504 or visiting the [facility] website at GoldCloset.com.ee. 7. How can I learn more?  Ask your doctor.  Call your local or state health department.  Contact the Centers for Disease Control and Prevention (CDC):  Call 412-003-4500 or visit CDC's website at http://hunter.com/. CDC Tdap Vaccine VIS (07/27/11) Document Released: 09/05/2011 Document Revised: 07/21/2013 Document Reviewed: 06/18/2013 [employer] Patient Information 2015 [place], [place]. This information is not intended to replace advice given to you by your health care provider. Make sure you discuss any questions you have with your health care provider.

## 2014-05-03 NOTE — Progress Notes (Signed)
Subjective:    Patient ID: Robert Atlas., male    DOB: 17-Nov-1980, 34 y.o.   MRN: [id number]  HPI Patient presents for piece of wood in his left eye that has been there for 1 week. Was sawing wood in a barn when felt a piece got in. Denies changes in vision, blurry/double vision, HA, and dizziness. Eye is not painful or has not been red. Endorses some photophobia and foreign body sensation. Is able to visualize the wood piece. No h/o eye problems or surgeries. Does not wear glasses or contacts. Has tried washing eye out, but that has not helped. NKDA.   Review of Systems  Constitutional: Negative for fever.  Eyes: Positive for photophobia. Negative for pain, discharge, redness, itching and visual disturbance.  Skin: Negative for rash.  Neurological: Negative for dizziness and headaches.       Objective:   Physical Exam  Constitutional: He is oriented to person, place, and time. He appears well-developed and well-nourished. No distress.  Blood pressure 116/70, pulse 100, temperature 97.7 F (36.5 C), temperature source Oral, resp. rate 16, height 5' 8.25" (1.734 m), weight 168 lb (76.204 kg), SpO2 99 %.  HENT:  Head: Normocephalic and atraumatic.  Right Ear: External ear normal.  Left Ear: External ear normal.  Eyes: Conjunctivae and EOM are normal. Pupils are equal, round, and reactive to light. Right eye exhibits no chemosis, no discharge, no exudate and no hordeolum. No foreign body present in the right eye. Left eye exhibits no chemosis, no discharge, no exudate and no hordeolum. Foreign body present in the left eye. No scleral icterus.  Slit lamp exam:      The right eye shows no corneal abrasion, no corneal flare, no corneal ulcer, no foreign body, no hyphema, no hypopyon, no fluorescein uptake and no anterior chamber bulge.       The left eye shows corneal abrasion, foreign body and fluorescein uptake. The left eye shows no corneal flare, no corneal ulcer, no hyphema, no  hypopyon and no anterior chamber bulge.    Neurological: He is alert and oriented to person, place, and time.  Skin: Skin is warm and dry. No rash noted. He is not diaphoretic. No erythema. No pallor.  Psychiatric: He has a normal mood and affect. His behavior is normal. Judgment and thought content normal.       Assessment & Plan:  1. Corneal abrasion, left, initial encounter 2. Foreign body in eye, left, initial encounter Able to visualize piece of wood. Eye swept with swab and piece of wood came out. Fluorescin uptake where wood piece was located. RTC 05/05/14 for re-evaluation. Sooner if vision changes. - Tdap vaccine greater than or equal to 7yo IM - ofloxacin (OCUFLOX) 0.3 % ophthalmic solution; Place 2 drops into the left eye every 4 (four) hours.  Dispense: 5 mL; Refill: 1    Hassaan Crite PA-C  [facility] 05/03/2014 9:10 AM

## 2014-05-12 ENCOUNTER — Other Ambulatory Visit: Payer: Self-pay | Admitting: Gastroenterology

## 2014-06-15 ENCOUNTER — Other Ambulatory Visit: Payer: Self-pay | Admitting: Gastroenterology

## 2014-06-22 ENCOUNTER — Telehealth: Payer: Self-pay | Admitting: Gastroenterology

## 2014-06-22 ENCOUNTER — Other Ambulatory Visit: Payer: Self-pay

## 2014-06-22 MED ORDER — ADALIMUMAB 40 MG/0.8ML ~~LOC~~ AJKT: 40.0000 mg | AUTO-INJECTOR | Freq: Once | SUBCUTANEOUS | Status: DC

## 2014-06-30 ENCOUNTER — Other Ambulatory Visit: Payer: Self-pay | Admitting: Gastroenterology

## 2014-07-24 ENCOUNTER — Other Ambulatory Visit: Payer: Self-pay | Admitting: Gastroenterology

## 2014-08-03 ENCOUNTER — Other Ambulatory Visit: Payer: Self-pay | Admitting: Gastroenterology

## 2014-09-22 ENCOUNTER — Other Ambulatory Visit: Payer: Self-pay | Admitting: Gastroenterology

## 2014-09-24 ENCOUNTER — Telehealth: Payer: Self-pay | Admitting: *Deleted

## 2014-09-24 NOTE — Telephone Encounter (Signed)
Dr Wiliam Ke refill request from Pharmacy for Xanax

## 2014-11-16 ENCOUNTER — Other Ambulatory Visit: Payer: Self-pay | Admitting: Gastroenterology

## 2014-11-16 NOTE — Telephone Encounter (Signed)
Dr [name], Can Elenore Rota have these refills

## 2014-11-17 MED ORDER — ALPRAZOLAM 1 MG PO TABS: ORAL_TABLET | ORAL | Status: DC

## 2014-11-17 NOTE — Addendum Note (Signed)
Addended by: Oda Kilts on: 11/17/2014 01:33 PM   Modules accepted: Orders

## 2014-11-17 NOTE — Telephone Encounter (Signed)
Ok'd by Dr [name], script printed but called in to [place]

## 2014-12-11 ENCOUNTER — Telehealth: Payer: Self-pay | Admitting: Gastroenterology

## 2014-12-11 ENCOUNTER — Other Ambulatory Visit: Payer: Self-pay

## 2014-12-11 DIAGNOSIS — Z79899 Other long term (current) drug therapy: Secondary | ICD-10-CM

## 2014-12-11 NOTE — Telephone Encounter (Signed)
Pharmacy contacted and medication refilled as per medication list. Patient contacted and instructed to go to the lab for TB testing. Yearly  appointment scheduled with Dr [name].

## 2014-12-13 LAB — QUANTIFERON TB GOLD ASSAY (BLOOD)
INTERFERON GAMMA RELEASE ASSAY: NEGATIVE
QUANTIFERON NIL VALUE: 0.02 [IU]/mL
QUANTIFERON TB AG MINUS NIL: 0.01 [IU]/mL
TB AG VALUE: 0.03 [IU]/mL

## 2014-12-29 ENCOUNTER — Telehealth: Payer: Self-pay | Admitting: Gastroenterology

## 2014-12-29 NOTE — Telephone Encounter (Signed)
Patient wants to establish with Dr Silverio Decamp. Appointment scheduled.

## 2015-01-04 NOTE — Telephone Encounter (Signed)
Medication sent.

## 2015-01-08 ENCOUNTER — Ambulatory Visit: Payer: Self-pay | Admitting: Gastroenterology

## 2015-02-09 ENCOUNTER — Other Ambulatory Visit: Payer: Self-pay | Admitting: Gastroenterology

## 2015-02-26 ENCOUNTER — Other Ambulatory Visit (INDEPENDENT_AMBULATORY_CARE_PROVIDER_SITE_OTHER): Payer: BLUE CROSS/BLUE SHIELD

## 2015-02-26 ENCOUNTER — Ambulatory Visit (INDEPENDENT_AMBULATORY_CARE_PROVIDER_SITE_OTHER): Payer: BLUE CROSS/BLUE SHIELD | Admitting: Gastroenterology

## 2015-02-26 ENCOUNTER — Encounter: Payer: Self-pay | Admitting: Gastroenterology

## 2015-02-26 VITALS — BP 100/70 | HR 72 | Ht 67.75 in | Wt 171.5 lb

## 2015-02-26 DIAGNOSIS — K50813 Crohn's disease of both small and large intestine with fistula: Secondary | ICD-10-CM

## 2015-02-26 DIAGNOSIS — K5010 Crohn's disease of large intestine without complications: Secondary | ICD-10-CM

## 2015-02-26 LAB — COMPREHENSIVE METABOLIC PANEL
ALT: 36 U/L (ref 0–53)
AST: 23 U/L (ref 0–37)
Albumin: 4.5 g/dL (ref 3.5–5.2)
Alkaline Phosphatase: 55 U/L (ref 39–117)
BUN: 15 mg/dL (ref 6–23)
CHLORIDE: 101 meq/L (ref 96–112)
CO2: 27 meq/L (ref 19–32)
Calcium: 9.6 mg/dL (ref 8.4–10.5)
Creatinine, Ser: 1.3 mg/dL (ref 0.40–1.50)
GFR: 66.94 mL/min (ref >60.00)
GLUCOSE: 123 mg/dL — AB (ref 70–99)
POTASSIUM: 3.4 meq/L — AB (ref 3.5–5.1)
SODIUM: 138 meq/L (ref 135–145)
Total Bilirubin: 0.6 mg/dL (ref 0.2–1.2)
Total Protein: 7.6 g/dL (ref 6.0–8.3)

## 2015-02-26 LAB — CBC WITH DIFFERENTIAL/PLATELET
Basophils Absolute: 0 10*3/uL (ref 0.0–0.1)
Basophils Relative: 0.4 % (ref 0.0–3.0)
EOS PCT: 9 % — AB (ref 0.0–5.0)
Eosinophils Absolute: 1 10*3/uL — ABNORMAL HIGH (ref 0.0–0.7)
HCT: 47.5 % (ref 39.0–52.0)
Hemoglobin: 16 g/dL (ref 13.0–17.0)
LYMPHS ABS: 3 10*3/uL (ref 0.7–4.0)
Lymphocytes Relative: 27.8 % (ref 12.0–46.0)
MCHC: 33.7 g/dL (ref 30.0–36.0)
MCV: 83.4 fl (ref 78.0–100.0)
MONO ABS: 0.7 10*3/uL (ref 0.1–1.0)
Monocytes Relative: 6.9 % (ref 3.0–12.0)
NEUTROS PCT: 55.9 % (ref 43.0–77.0)
Neutro Abs: 5.9 10*3/uL (ref 1.4–7.7)
PLATELETS: 298 10*3/uL (ref 150.0–400.0)
RBC: 5.69 Mil/uL (ref 4.22–5.81)
RDW: 12.7 % (ref 11.5–15.5)
WBC: 10.6 10*3/uL — ABNORMAL HIGH (ref 4.0–10.5)

## 2015-02-26 LAB — HIGH SENSITIVITY CRP: CRP, High Sensitivity: 0.5 mg/L (ref 0.000–5.000)

## 2015-02-26 LAB — FOLATE: Folate: >23.8 ng/mL (ref >5.9)

## 2015-02-26 LAB — FERRITIN: Ferritin: 101.8 ng/mL (ref 22.0–322.0)

## 2015-02-26 LAB — VITAMIN B12: Vitamin B-12: 358 pg/mL (ref 211–911)

## 2015-02-26 MED ORDER — ALPRAZOLAM 1 MG PO TABS: 1.0000 mg | ORAL_TABLET | Freq: Every evening | ORAL | Status: DC | PRN

## 2015-02-26 NOTE — Patient Instructions (Signed)
Go to the basement for labs today Your recall colonoscopy is due in 2020  We will refill your Xanax prescription

## 2015-02-26 NOTE — Progress Notes (Signed)
Robert Jenkins    [id number]    [birth year]  Primary Care Physician:PHILLIPS Benson Setting, MD  Referring Physician: Marcine Matar., MD [address] 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 He is been doing well on Humira weekly injections. His last flare was in September 2015 for which he was on short course of prednisone. He currently has no complaints. Denies any nausea, vomiting, abdominal pain, melena or bright red blood per rectum Last colonoscopy in 2013 showed active inflammation in the site of anastomosis. Patient is afraid of needles and every time he has to take his Humira shot he premedicate himself with Xanax   Outpatient Encounter Prescriptions as of 02/26/2015  Medication Sig  . Adalimumab (HUMIRA PEN) 40 MG/0.8ML PNKT Inject 40 mg into the skin once. Inject SQ once every week.  . [DISCONTINUED] ALPRAZolam (XANAX) 1 MG tablet TAKE AS DIRECTED WITH HUMIRA TREATMENT (Patient not taking: Reported on 02/26/2015)  . [DISCONTINUED] hyoscyamine (LEVSIN, ANASPAZ) 0.125 MG tablet TAKE 1 TABLET BY MOUTH EVERY 4 (FOUR) HOURS AS NEEDED FOR CRAMPING.  . [DISCONTINUED] ofloxacin (OCUFLOX) 0.3 % ophthalmic solution Place 2 drops into the left eye every 4 (four) hours.  . [DISCONTINUED] oxyCODONE-acetaminophen (PERCOCET) 10-325 MG per tablet Take 1 tablet by mouth every 6 (six) hours as needed for pain. (Patient not taking: Reported on 05/03/2014)  . [DISCONTINUED] predniSONE (DELTASONE) 10 MG tablet Use as directed Begin 4 tabs once daily (Patient not taking: Reported on 05/03/2014)  . [DISCONTINUED] predniSONE (DELTASONE) 10 MG tablet USE AS DIRECTED  . [DISCONTINUED] predniSONE (DELTASONE) 20 MG tablet Take 1 tablet (20  mg total) by mouth daily with breakfast. (Patient not taking: Reported on 05/03/2014)   No facility-administered encounter medications on file as of 02/26/2015.    Allergies as of 02/26/2015  . (No Known Allergies)    Past Medical History  Diagnosis Date  . Complication of anesthesia     "mental recovery"  . Crohn's disease [facility])     Past Surgical History  Procedure Laterality Date  . Appendectomy    . Laparotomy  02/27/2011    Procedure: EXPLORATORY LAPAROTOMY;  Surgeon: Iona Coach, MD;  Location: [facility] ORS;  Service: General;  Laterality: N/A;  ileocecotomy  . Colon surgery  2012    Family History  Problem Relation Age of Onset  . Skin cancer Mother   . Cancer Mother     melanoma    Social History   Social History  . Marital Status: Married    Spouse Name: N/A  . Number of Children: 0  . Years of Education: N/A   Occupational History  . Works for the [employer]   .     Social History Main Topics  . Smoking status: Never Smoker   . Smokeless tobacco: Never Used  . Alcohol Use: No  . Drug Use: No  . Sexual Activity: No   Other Topics Concern  . Not on file   Social History Narrative      Review of systems: Review of Systems  Constitutional: Negative for fever and chills.  HENT: Negative.   Eyes: Negative for blurred vision.  Respiratory: Negative for cough, shortness of breath and wheezing.  Cardiovascular: Negative for chest pain and palpitations.  Gastrointestinal: as per HPI Genitourinary: Negative for dysuria, urgency, frequency and hematuria.  Musculoskeletal: Negative for myalgias, back pain and joint pain.  Skin: Negative for itching and rash.  Neurological: Negative for dizziness, tremors, focal weakness, seizures and loss of consciousness.  Endo/Heme/Allergies: Negative for environmental allergies.  Psychiatric/Behavioral: Negative for depression, suicidal ideas and hallucinations.  All other systems reviewed and are  negative.   Physical Exam: Filed Vitals:   02/26/15 1403  BP: 100/70  Pulse: 72   Gen:      No acute distress HEENT:  EOMI, sclera anicteric Neck:     No masses; no thyromegaly Lungs:    Clear to auscultation bilaterally; normal respiratory effort CV:         Regular rate and rhythm; no murmurs Abd:      + bowel sounds; soft, non-tender; no palpable masses, no distension Ext:    No edema; adequate peripheral perfusion Skin:      Warm and dry; no rash Neuro: alert and oriented x 3 Psych: normal mood and affect  Data Reviewed: CT abd & pelvis 11/2013 Postoperative changes are noted in the region of the cecum, presumably from prior terminal ileectomy and reanastomosis. There is extensive soft tissue thickening of the neo terminal ileum in this region immediately prior to the anastomosis, and surrounding inflammatory changes suggesting acute inflammation in the setting of acute Crohn's disease flare. Numerous enlarged ileocolic lymph nodes are noted, measuring up to 11 mm in short axis. No discrete abscess identified at this time. No significant volume of free fluid. No pneumoperitoneum. No pathologic distention of small bowel. Prostate gland and urinary bladder are unremarkable in appearance.  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 Patient is currently asymptomatic and appears to be in remission We'll check CBC, CMP, CRP, ferritin, folate and B12 level Due for recall colonoscopy in 2020 We'll refill his medications Return in 1 year or sooner if needed  K. Scherry Ran , MD [phone] Mon-Fri 8a-5p [phone] after 5p, weekends,  holidays

## 2015-04-16 ENCOUNTER — Telehealth: Payer: Self-pay | Admitting: Gastroenterology

## 2015-04-16 MED ORDER — HYOSCYAMINE SULFATE 0.125 MG SL SUBL: 0.1250 mg | SUBLINGUAL_TABLET | SUBLINGUAL | Status: DC | PRN

## 2015-04-16 NOTE — Telephone Encounter (Signed)
Patient called med sent

## 2015-06-30 ENCOUNTER — Other Ambulatory Visit: Payer: Self-pay

## 2015-06-30 ENCOUNTER — Telehealth: Payer: Self-pay | Admitting: Gastroenterology

## 2015-06-30 MED ORDER — ADALIMUMAB 40 MG/0.8ML ~~LOC~~ AJKT: 40.0000 mg | AUTO-INJECTOR | Freq: Once | SUBCUTANEOUS | Status: DC

## 2015-06-30 NOTE — Telephone Encounter (Signed)
I have left message for the patient to call back. I need the phone number for the pharmacy. I do not have a refill request.

## 2015-06-30 NOTE — Telephone Encounter (Signed)
Pt called back and the Specialty Pharmacy phone# is [phone]

## 2015-07-22 ENCOUNTER — Other Ambulatory Visit: Payer: Self-pay | Admitting: Gastroenterology

## 2015-07-30 ENCOUNTER — Telehealth: Payer: Self-pay | Admitting: Gastroenterology

## 2015-07-30 MED ORDER — ALPRAZOLAM 1 MG PO TABS: 1.0000 mg | ORAL_TABLET | Freq: Every evening | ORAL | Status: DC | PRN

## 2015-07-30 NOTE — Telephone Encounter (Signed)
Ok to refill 

## 2015-07-30 NOTE — Telephone Encounter (Signed)
Dr Silverio Decamp can I refill his xanax? We only give him a few at a time. He only takes this with His Humira treatments to take one before he gives himself his injections

## 2015-07-30 NOTE — Telephone Encounter (Signed)
Called in prescription to patients pharmacy

## 2015-08-12 ENCOUNTER — Encounter (HOSPITAL_COMMUNITY): Payer: Self-pay | Admitting: Emergency Medicine

## 2015-08-12 ENCOUNTER — Emergency Department (HOSPITAL_COMMUNITY)
Admission: EM | Admit: 2015-08-12 | Discharge: 2015-08-13 | Disposition: A | Discharge status: Home / Self Care | Payer: BLUE CROSS/BLUE SHIELD | Attending: Emergency Medicine | Admitting: Emergency Medicine

## 2015-08-12 ENCOUNTER — Emergency Department (HOSPITAL_COMMUNITY): Payer: BLUE CROSS/BLUE SHIELD

## 2015-08-12 DIAGNOSIS — F419 Anxiety disorder, unspecified: Secondary | ICD-10-CM | POA: Insufficient documentation

## 2015-08-12 DIAGNOSIS — Z9049 Acquired absence of other specified parts of digestive tract: Secondary | ICD-10-CM | POA: Diagnosis not present

## 2015-08-12 DIAGNOSIS — Z792 Long term (current) use of antibiotics: Secondary | ICD-10-CM | POA: Diagnosis not present

## 2015-08-12 DIAGNOSIS — N200 Calculus of kidney: Secondary | ICD-10-CM

## 2015-08-12 DIAGNOSIS — R109 Unspecified abdominal pain: Secondary | ICD-10-CM | POA: Diagnosis present

## 2015-08-12 DIAGNOSIS — K5090 Crohn's disease, unspecified, without complications: Secondary | ICD-10-CM | POA: Insufficient documentation

## 2015-08-12 LAB — CBC WITH DIFFERENTIAL/PLATELET
BASOS ABS: 0.1 10*3/uL (ref 0.0–0.1)
Basophils Relative: 1 %
EOS PCT: 9 %
Eosinophils Absolute: 0.9 10*3/uL — ABNORMAL HIGH (ref 0.0–0.7)
HEMATOCRIT: 45.4 % (ref 39.0–52.0)
Hemoglobin: 15.4 g/dL (ref 13.0–17.0)
LYMPHS PCT: 47 %
Lymphs Abs: 4.5 10*3/uL — ABNORMAL HIGH (ref 0.7–4.0)
MCH: 27.7 pg (ref 26.0–34.0)
MCHC: 33.9 g/dL (ref 30.0–36.0)
MCV: 81.7 fL (ref 78.0–100.0)
MONO ABS: 0.6 10*3/uL (ref 0.1–1.0)
MONOS PCT: 7 %
NEUTROS ABS: 3.4 10*3/uL (ref 1.7–7.7)
Neutrophils Relative %: 36 %
PLATELETS: 254 10*3/uL (ref 150–400)
RBC: 5.56 MIL/uL (ref 4.22–5.81)
RDW: 12.9 % (ref 11.5–15.5)
WBC: 9.4 10*3/uL (ref 4.0–10.5)

## 2015-08-12 LAB — COMPREHENSIVE METABOLIC PANEL
ALBUMIN: 4.2 g/dL (ref 3.5–5.0)
ALK PHOS: 52 U/L (ref 38–126)
ALT: 50 U/L (ref 17–63)
ANION GAP: 11 (ref 5–15)
AST: 28 U/L (ref 15–41)
BILIRUBIN TOTAL: 0.5 mg/dL (ref 0.3–1.2)
BUN: 14 mg/dL (ref 6–20)
CALCIUM: 9.4 mg/dL (ref 8.9–10.3)
CO2: 22 mmol/L (ref 22–32)
Chloride: 102 mmol/L (ref 101–111)
Creatinine, Ser: 1.39 mg/dL — ABNORMAL HIGH (ref 0.61–1.24)
Glucose, Bld: 103 mg/dL — ABNORMAL HIGH (ref 65–99)
POTASSIUM: 3 mmol/L — AB (ref 3.5–5.1)
Sodium: 135 mmol/L (ref 135–145)
TOTAL PROTEIN: 7.4 g/dL (ref 6.5–8.1)

## 2015-08-12 LAB — URINALYSIS, ROUTINE W REFLEX MICROSCOPIC
BILIRUBIN URINE: NEGATIVE
Glucose, UA: NEGATIVE mg/dL
Ketones, ur: NEGATIVE mg/dL
Leukocytes, UA: NEGATIVE
NITRITE: NEGATIVE
PH: 6 (ref 5.0–8.0)
Protein, ur: NEGATIVE mg/dL
SPECIFIC GRAVITY, URINE: 1.015 (ref 1.005–1.030)

## 2015-08-12 LAB — LIPASE, BLOOD: Lipase: 26 U/L (ref 11–51)

## 2015-08-12 LAB — URINE MICROSCOPIC-ADD ON: WBC UA: NONE SEEN WBC/hpf (ref 0–5)

## 2015-08-12 MED ORDER — POTASSIUM CHLORIDE CRYS ER 20 MEQ PO TBCR
40.0000 meq | EXTENDED_RELEASE_TABLET | Freq: Once | ORAL | Status: AC
  Administered 2015-08-12: 40 meq via ORAL
  Filled 2015-08-12: qty 2

## 2015-08-12 MED ORDER — KETOROLAC TROMETHAMINE 30 MG/ML IJ SOLN
30.0000 mg | Freq: Once | INTRAMUSCULAR | Status: AC
  Administered 2015-08-12: 30 mg via INTRAVENOUS
  Filled 2015-08-12: qty 1

## 2015-08-12 MED ORDER — ONDANSETRON 4 MG PO TBDP: 4.0000 mg | ORAL_TABLET | Freq: Three times a day (TID) | ORAL | Status: DC | PRN

## 2015-08-12 MED ORDER — HYDROMORPHONE HCL 1 MG/ML IJ SOLN
0.5000 mg | Freq: Once | INTRAMUSCULAR | Status: AC
  Administered 2015-08-12: 0.5 mg via INTRAVENOUS
  Filled 2015-08-12: qty 1

## 2015-08-12 MED ORDER — KETOROLAC TROMETHAMINE 10 MG PO TABS: 10.0000 mg | ORAL_TABLET | Freq: Four times a day (QID) | ORAL | Status: DC | PRN

## 2015-08-12 MED ORDER — SODIUM CHLORIDE 0.9 % IV BOLUS (SEPSIS)
1000.0000 mL | Freq: Once | INTRAVENOUS | Status: AC
  Administered 2015-08-12: 1000 mL via INTRAVENOUS

## 2015-08-12 MED ORDER — OXYCODONE-ACETAMINOPHEN 5-325 MG PO TABS: 1.0000 | ORAL_TABLET | Freq: Four times a day (QID) | ORAL | Status: DC | PRN

## 2015-08-12 MED ORDER — POTASSIUM CHLORIDE ER 10 MEQ PO TBCR: 10.0000 meq | EXTENDED_RELEASE_TABLET | Freq: Every day | ORAL | Status: DC

## 2015-08-12 NOTE — ED Notes (Signed)
Patient transported to CT 

## 2015-08-12 NOTE — ED Provider Notes (Signed)
CSN: [id number]     Arrival date & time 08/12/15  1622 History   First MD Initiated Contact with Patient 08/12/15 1657     Chief Complaint  Patient presents with  . Back Pain     (Consider location/radiation/quality/duration/timing/severity/associated sxs/prior Treatment) HPI Comments: Robert Jenkins. is a 35 y.o. Male with history of Crohn's status post bowel resection and s/p appendectomy presents to ED with complaint of right flank pain. Patient reports intermittent back pain starting on Tuesday night. He experienced pain with urination. Associated symptoms include frequent urination and dysuria. [facility] provided a urine sample, and received antibiotics for possible UTI. Patient reports pain has worsened and is now constant in nature. Pain traverses the right flank. Denies fevers. Complains of chills and night sweats. Denies blood in the urine, diarrhea, constipation, hematochezia,  Melena, penile discharge, scrotal swelling, or scrotal pain. He has tried Azo and tramadol with minimal relief. No trauma.  Patient is a 35 y.o. male presenting with back pain. The history is provided by the patient and the spouse.  Back Pain Associated symptoms: abdominal pain and dysuria   Associated symptoms: no chest pain, no fever, no headaches and no weakness     Past Medical History  Diagnosis Date  . Complication of anesthesia     "mental recovery"  . Crohn's disease [facility])    Past Surgical History  Procedure Laterality Date  . Appendectomy    . Laparotomy  02/27/2011    Procedure: EXPLORATORY LAPAROTOMY;  Surgeon: Willey Blade, MD;  Location: [facility];  Service: General;  Laterality: N/A;  ileocecotomy  . Colon surgery  2012   Family History  Problem Relation Age of Onset  . Skin cancer Mother   . Cancer Mother     melanoma   Social History  Substance Use Topics  . Smoking status: Never Smoker   . Smokeless tobacco: Never Used  . Alcohol Use: No     Review of Systems  Constitutional: Positive for chills and diaphoresis. Negative for fever.  HENT: Negative for sore throat and trouble swallowing.   Eyes: Negative for visual disturbance.  Respiratory: Negative for shortness of breath.   Cardiovascular: Negative for chest pain.  Gastrointestinal: Positive for abdominal pain. Negative for nausea, vomiting, diarrhea, constipation and blood in stool.  Genitourinary: Positive for dysuria, frequency and flank pain. Negative for hematuria, discharge, scrotal swelling and testicular pain.  Musculoskeletal: Positive for back pain. Negative for neck pain and neck stiffness.  Skin: Negative for rash.  Allergic/Immunologic: Positive for immunocompromised state.  Neurological: Negative for dizziness, weakness, light-headedness and headaches.      Allergies  Review of patient's allergies indicates no known allergies.  Home Medications   Prior to Admission medications   Medication Sig Start Date End Date Taking? Authorizing Provider  Adalimumab (HUMIRA PEN) 40 MG/0.8ML PNKT Inject 40 mg into the skin once. Inject SQ once every week. 06/30/15  Yes Mauri Pole, MD  ALPRAZolam (XANAX) 1 MG tablet Take 1 tablet (1 mg total) by mouth at bedtime as needed for anxiety (TO USE AS DIRECTED WITH HUMIRA TREATMENT). 07/30/15  Yes Mauri Pole, MD  hyoscyamine (LEVSIN SL) 0.125 MG SL tablet Place 1 tablet (0.125 mg total) under the tongue every 4 (four) hours as needed. 04/16/15  Yes Mauri Pole, MD  sulfamethoxazole-trimethoprim (BACTRIM DS,SEPTRA DS) 800-160 MG tablet Take 1 tablet by mouth every 12 (twelve) hours. Take for 10 days. Started on 08-11-15 per  patient   Yes Historical Provider, MD  ketorolac (TORADOL) 10 MG tablet Take 1 tablet (10 mg total) by mouth every 6 (six) hours as needed. 08/12/15   Roxanna Mew, PA-C  ondansetron (ZOFRAN ODT) 4 MG disintegrating tablet Take 1 tablet (4 mg total) by mouth every 8 (eight) hours as  needed for nausea or vomiting. 08/12/15   Roxanna Mew, PA-C  oxyCODONE-acetaminophen (PERCOCET/ROXICET) 5-325 MG tablet Take 1 tablet by mouth every 6 (six) hours as needed for severe pain. 08/12/15   Roxanna Mew, PA-C   BP 111/85 mmHg  Pulse 69  Temp(Src) 97.3 F (36.3 C) (Oral)  Resp 16  SpO2 98% Physical Exam  Constitutional: He appears well-developed and well-nourished.  Appears figidity, and unable to find a comfortable position.   HENT:  Head: Normocephalic and atraumatic.  Mouth/Throat: Oropharynx is clear and moist. No oropharyngeal exudate.  Eyes: Conjunctivae and EOM are normal. Pupils are equal, round, and reactive to light. Right eye exhibits no discharge. Left eye exhibits no discharge. No scleral icterus.  Neck: Normal range of motion. Neck supple.  Cardiovascular: Normal rate, regular rhythm, normal heart sounds and intact distal pulses.   No murmur heard. Pulmonary/Chest: Effort normal and breath sounds normal. No respiratory distress. He has no wheezes.  Abdominal: Soft. Bowel sounds are normal. He exhibits no pulsatile midline mass and no mass. There is tenderness. There is guarding and CVA tenderness. There is no rebound.  Well healed longitudinal scar noted on abdomen. Positive bowel sounds. Abdomen is firm with diffuse tenderness and guarding. Positive right CVA tenderness.   Musculoskeletal: Normal range of motion. He exhibits no edema or tenderness.  Lymphadenopathy:    He has no cervical adenopathy.  Neurological: He is alert. Coordination normal.  Skin: Skin is warm and dry. He is not diaphoretic.  Psychiatric: He has a normal mood and affect. His behavior is normal.    ED Course  Procedures (including critical care time) Labs Review Labs Reviewed  CBC WITH DIFFERENTIAL/PLATELET - Abnormal; Notable for the following:    Lymphs Abs 4.5 (*)    Eosinophils Absolute 0.9 (*)    All other components within normal limits  COMPREHENSIVE METABOLIC  PANEL - Abnormal; Notable for the following:    Potassium 3.0 (*)    Glucose, Bld 103 (*)    Creatinine, Ser 1.39 (*)    All other components within normal limits  URINALYSIS, ROUTINE W REFLEX MICROSCOPIC (NOT AT [facility]) - Abnormal; Notable for the following:    Hgb urine dipstick TRACE (*)    All other components within normal limits  URINE MICROSCOPIC-ADD ON - Abnormal; Notable for the following:    Squamous Epithelial / LPF 0-5 (*)    Bacteria, UA RARE (*)    All other components within normal limits  LIPASE, BLOOD    Imaging Review Ct Renal Stone Study  08/12/2015  CLINICAL DATA:  Acute onset of right flank pain for 2 days. Initial encounter. EXAM: CT ABDOMEN AND PELVIS WITHOUT CONTRAST TECHNIQUE: Multidetector CT imaging of the abdomen and pelvis was performed following the standard protocol without IV contrast. COMPARISON:  CT of the abdomen and pelvis from 12/05/2013 FINDINGS: The visualized lung bases are clear. The liver and spleen are unremarkable in appearance. The gallbladder is within normal limits. The pancreas and adrenal glands are unremarkable. There is mild prominence of the right ureter along its entire course, with an intermittently obstructing 4 mm stone noted distally at the right vesicoureteral junction.  No significant hydronephrosis is seen. A few tiny nonobstructing bilateral renal stones are seen. The kidneys are otherwise unremarkable. No free fluid is identified. The small bowel is unremarkable in appearance. The stomach is within normal limits. No acute vascular abnormalities are seen. The patient is status post resection of the cecum, with ileocolic anastomosis. The anastomosis is unremarkable in appearance. The colon is grossly unremarkable. The bladder is mildly distended and grossly unremarkable in appearance. No inguinal lymphadenopathy is seen. No acute osseous abnormalities are identified. IMPRESSION: 1. Mild prominence of the right ureter along its entire course,  with an intermittently obstructing 4 mm stone noted distally at the right vesicoureteral junction. No significant hydronephrosis seen at this time. 2. Few tiny nonobstructing bilateral renal stones seen. 3. Ileocolic anastomosis is unremarkable in appearance. Electronically Signed   By: Garald Balding M.D.   On: 08/12/2015 20:47   I have personally reviewed and evaluated these images and lab results as part of my medical decision-making.   EKG Interpretation None      MDM   Final diagnoses:  Nephrolithiasis    Patient is afebrile and anxious appearing. He appears unable to get comfortable and is fidgeting. Vital signs are stable. Given history concern for possible kidney stone. Other considerations on differential include pyelonephritis, UTI, Crohn's exacerbation, cholecystitis, pancreatitis, obstruction. Will order labs and CT renal study. Toradol and pain meds given.  CBC unremarkable. Lipase normal. Mild kidney injury per serum creatinine. Will give IVF. Mild hypokalemia. Will give by mouth potassium chloride. Repeat pain meds provided.    U/A positive for trace hemoglobin, bacteria rare with some squamous epithelials-? Specimen contamination, no nitrites or leukocytes. Pt currently on antibiotics for UTI. CT positive for intermittently obstructing 4 mm stone right vesicoureteral junction. Tiny non-obstructing renal stones bilaterally. No significant hydronephrosis at this time.   Patient endorses improvement with pain medication. Discussed results and plan with the patient. Prescribed Toradol PO for 3 days, Zofran, pain medication, and potassium chloride. Encouraged hydration. Provided contact information for Urology. Encouraged follow up with Urology in the next 3 days for re-evaluation. Discussed return precautions. Patient voiced understanding and is agreeable.  Roxanna Mew, [place] 08/13/15 [place], MD 08/15/15 [phone]

## 2015-08-12 NOTE — ED Notes (Signed)
Pt sts right sided back pain x 3 days around to flank area

## 2015-08-12 NOTE — Discharge Instructions (Signed)
Read the information below.   Use the prescribed medication as directed.  Please discuss all new medications with your pharmacist.  Do not take Toradol for more than three days. Zofran is for nausea as needed. You have also been prescribed percocet, take as needed every 6 hours for severe pain.  Your potassium was a little low. You received potassium in the ED. You are being prescribed potassium to take daily.  Be sure to follow up with Urology. Contact information has been provided, be sure to call and schedule an appointment in the next three days.   You may return to the Emergency Department at any time for worsening condition or any new symptoms that concern you. Return to the ED if you develop fever or chills, new abdominal pain, pain that is not controlled with prescribed medications, inability to urinate, or loss of consciousness.     Kidney Stones Kidney stones (urolithiasis) are deposits that form inside your kidneys. The intense pain is caused by the stone moving through the urinary tract. When the stone moves, the ureter goes into spasm around the stone. The stone is usually passed in the urine.  CAUSES   A disorder that makes certain neck glands produce too much parathyroid hormone (primary hyperparathyroidism).  A buildup of uric acid crystals, similar to gout in your joints.  Narrowing (stricture) of the ureter.  A kidney obstruction present at birth (congenital obstruction).  Previous surgery on the kidney or ureters.  Numerous kidney infections. SYMPTOMS   Feeling sick to your stomach (nauseous).  Throwing up (vomiting).  Blood in the urine (hematuria).  Pain that usually spreads (radiates) to the groin.  Frequency or urgency of urination. DIAGNOSIS   Taking a history and physical exam.  Blood or urine tests.  CT scan.  Occasionally, an examination of the inside of the urinary bladder (cystoscopy) is performed. TREATMENT   Observation.  Increasing your  fluid intake.  Extracorporeal shock wave lithotripsy--This is a noninvasive procedure that uses shock waves to break up kidney stones.  Surgery may be needed if you have severe pain or persistent obstruction. There are various surgical procedures. Most of the procedures are performed with the use of small instruments. Only small incisions are needed to accommodate these instruments, so recovery time is minimized. The size, location, and chemical composition are all important variables that will determine the proper choice of action for you. Talk to your health care provider to better understand your situation so that you will minimize the risk of injury to yourself and your kidney.  HOME CARE INSTRUCTIONS   Drink enough water and fluids to keep your urine clear or pale yellow. This will help you to pass the stone or stone fragments.  Strain all urine through the provided strainer. Keep all particulate matter and stones for your health care provider to see. The stone causing the pain may be as small as a grain of salt. It is very important to use the strainer each and every time you pass your urine. The collection of your stone will allow your health care provider to analyze it and verify that a stone has actually passed. The stone analysis will often identify what you can do to reduce the incidence of recurrences.  Only take over-the-counter or prescription medicines for pain, discomfort, or fever as directed by your health care provider.  Keep all follow-up visits as told by your health care provider. This is important.  Get follow-up X-rays if required. The absence of  pain does not always mean that the stone has passed. It may have only stopped moving. If the urine remains completely obstructed, it can cause loss of kidney function or even complete destruction of the kidney. It is your responsibility to make sure X-rays and follow-ups are completed. Ultrasounds of the kidney can show blockages and  the status of the kidney. Ultrasounds are not associated with any radiation and can be performed easily in a matter of minutes.  Make changes to your daily diet as told by your health care provider. You may be told to:  Limit the amount of salt that you eat.  Eat 5 or more servings of fruits and vegetables each day.  Limit the amount of meat, poultry, fish, and eggs that you eat.  Collect a 24-hour urine sample as told by your health care provider.You may need to collect another urine sample every 6-12 months. SEEK MEDICAL CARE IF:  You experience pain that is progressive and unresponsive to any pain medicine you have been prescribed. SEEK IMMEDIATE MEDICAL CARE IF:   Pain cannot be controlled with the prescribed medicine.  You have a fever or shaking chills.  The severity or intensity of pain increases over 18 hours and is not relieved by pain medicine.  You develop a new onset of abdominal pain.  You feel faint or pass out.  You are unable to urinate.   This information is not intended to replace advice given to you by your health care provider. Make sure you discuss any questions you have with your health care provider.   Document Released: 03/06/2005 Document Revised: 11/25/2014 Document Reviewed: 08/07/2012 Elsevier Interactive Patient Education [employer].

## 2015-08-12 NOTE — ED Notes (Signed)
Pt returned from CT to room A07

## 2015-08-12 NOTE — ED Notes (Addendum)
RN called CT scan and spoke with Suanne Marker; asked for estimated time before scan; states one person ahead of this patient; pt updated

## 2016-03-24 ENCOUNTER — Other Ambulatory Visit: Payer: Self-pay | Admitting: Gastroenterology

## 2016-03-28 NOTE — Telephone Encounter (Signed)
Called in script for patient, pharmacy saying they never received fax

## 2016-03-28 NOTE — Telephone Encounter (Signed)
Dr Silverio Decamp patient needs a refill on xanax, we only give him these to him to take before he gives himself his Humeria injection   It takes him

## 2016-03-28 NOTE — Telephone Encounter (Signed)
Dr Silverio Decamp 15 lasts him a year almost   Is it ok to refill

## 2016-09-07 ENCOUNTER — Telehealth: Payer: Self-pay | Admitting: *Deleted

## 2016-09-07 NOTE — Telephone Encounter (Signed)
Sent refills of Humira till Patients ov in Aug  Pt has not been seen since 2016

## 2016-09-12 ENCOUNTER — Other Ambulatory Visit: Payer: Self-pay | Admitting: Gastroenterology

## 2016-10-26 ENCOUNTER — Encounter: Payer: Self-pay | Admitting: Gastroenterology

## 2016-10-26 ENCOUNTER — Other Ambulatory Visit (INDEPENDENT_AMBULATORY_CARE_PROVIDER_SITE_OTHER): Payer: 59

## 2016-10-26 ENCOUNTER — Ambulatory Visit (INDEPENDENT_AMBULATORY_CARE_PROVIDER_SITE_OTHER): Payer: 59 | Admitting: Gastroenterology

## 2016-10-26 VITALS — BP 104/84 | HR 96 | Ht 67.75 in | Wt 166.5 lb

## 2016-10-26 DIAGNOSIS — B829 Intestinal parasitism, unspecified: Secondary | ICD-10-CM

## 2016-10-26 DIAGNOSIS — K5010 Crohn's disease of large intestine without complications: Secondary | ICD-10-CM | POA: Diagnosis not present

## 2016-10-26 DIAGNOSIS — K9089 Other intestinal malabsorption: Secondary | ICD-10-CM

## 2016-10-26 LAB — COMPREHENSIVE METABOLIC PANEL
ALBUMIN: 4.7 g/dL (ref 3.5–5.2)
ALT: 44 U/L (ref 0–53)
AST: 21 U/L (ref 0–37)
Alkaline Phosphatase: 65 U/L (ref 39–117)
BUN: 12 mg/dL (ref 6–23)
CO2: 31 meq/L (ref 19–32)
Calcium: 9.4 mg/dL (ref 8.4–10.5)
Chloride: 102 mEq/L (ref 96–112)
Creatinine, Ser: 1.01 mg/dL (ref 0.40–1.50)
GFR: 88.73 mL/min (ref >60.00)
Glucose, Bld: 97 mg/dL (ref 70–99)
POTASSIUM: 3.6 meq/L (ref 3.5–5.1)
SODIUM: 138 meq/L (ref 135–145)
Total Bilirubin: 0.5 mg/dL (ref 0.2–1.2)
Total Protein: 7.8 g/dL (ref 6.0–8.3)

## 2016-10-26 LAB — CBC WITH DIFFERENTIAL/PLATELET
BASOS PCT: 0.6 % (ref 0.0–3.0)
Basophils Absolute: 0.1 10*3/uL (ref 0.0–0.1)
EOS ABS: 1 10*3/uL — AB (ref 0.0–0.7)
EOS PCT: 10.1 % — AB (ref 0.0–5.0)
HCT: 47.1 % (ref 39.0–52.0)
HEMOGLOBIN: 16 g/dL (ref 13.0–17.0)
Lymphocytes Relative: 35.1 % (ref 12.0–46.0)
Lymphs Abs: 3.5 10*3/uL (ref 0.7–4.0)
MCHC: 33.9 g/dL (ref 30.0–36.0)
MCV: 83.4 fl (ref 78.0–100.0)
MONO ABS: 0.7 10*3/uL (ref 0.1–1.0)
Monocytes Relative: 7.5 % (ref 3.0–12.0)
NEUTROS ABS: 4.7 10*3/uL (ref 1.4–7.7)
Neutrophils Relative %: 46.7 % (ref 43.0–77.0)
Platelets: 282 10*3/uL (ref 150.0–400.0)
RBC: 5.65 Mil/uL (ref 4.22–5.81)
RDW: 13.1 % (ref 11.5–15.5)
WBC: 10 10*3/uL (ref 4.0–10.5)

## 2016-10-26 LAB — IBC PANEL
IRON: 54 ug/dL (ref 42–165)
SATURATION RATIOS: 13.8 % — AB (ref 20.0–50.0)
TRANSFERRIN: 279 mg/dL (ref 212.0–360.0)

## 2016-10-26 LAB — SEDIMENTATION RATE: SED RATE: 10 mm/h (ref 0–15)

## 2016-10-26 LAB — FERRITIN: Ferritin: 115 ng/mL (ref 22.0–322.0)

## 2016-10-26 LAB — HIGH SENSITIVITY CRP: CRP HIGH SENSITIVITY: 2.03 mg/L (ref 0.000–5.000)

## 2016-10-26 LAB — VITAMIN B12: Vitamin B-12: 253 pg/mL (ref 211–911)

## 2016-10-26 LAB — FOLATE

## 2016-10-26 MED ORDER — ADALIMUMAB 40 MG/0.8ML ~~LOC~~ AJKT: 40.0000 mg | AUTO-INJECTOR | SUBCUTANEOUS | 3 refills | Status: DC

## 2016-10-26 MED ORDER — CHOLESTYRAMINE LIGHT 4 G PO PACK: 4.0000 g | PACK | Freq: Two times a day (BID) | ORAL | 3 refills | Status: DC

## 2016-10-26 MED ORDER — PRAZIQUANTEL 600 MG PO TABS: 600.0000 mg | ORAL_TABLET | Freq: Once | ORAL | 0 refills | Status: AC

## 2016-10-26 NOTE — Patient Instructions (Addendum)
Your physician has requested that you go to the basement for lab work before leaving today.   Collect and return stool sample in 2 weeks.  We have sent the following medications to your pharmacy for you to pick up at your convenience:  Praziquantal Humira Cholestyramine   We have scheduled you for a follow up appt for August 17 at 2:45pm.  If you are age 36 or older, your body mass index should be between 23-30. Your Body mass index is 25.5 kg/m. If this is out of the aforementioned range listed, please consider follow up with your Primary Care Provider.  If you are age 72 or younger, your body mass index should be between 19-25. Your Body mass index is 25.5 kg/m. If this is out of the aformentioned range listed, please consider follow up with your Primary Care Provider.

## 2016-10-26 NOTE — Progress Notes (Signed)
Robert Jenkins    [id number]    1980/06/03  Primary Care Physician:Phillips, Benson Setting., MD  Referring Physician: Marcine Matar., MD [address] 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 Patient is currently on Humira 40 mg weekly injection. He is doing well with no complaints. He had stool testing by veterinary professor at [facility] [employer] [facility] who is a family friend and he tested positive for tape worm. Patient brought the report with him. He he is having 4-5 semi-formed soft bowel movements daily, no significant change. Denies any blood in stool, nausea, vomiting, abdominal pain or melena. Denies any reaction at the injection site.   Last colonoscopy in 2013 showed active inflammation in the site of anastomosis. Patient is afraid of needles and every time he has to take his Humira shot he premedicate himself with Xanax  Outpatient Encounter Prescriptions as of 10/26/2016  Medication Sig  . Adalimumab (HUMIRA PEN) 40 MG/0.8ML PNKT Inject 40 mg into the skin once. Inject SQ once every week.  . ALPRAZolam (XANAX) 1 MG tablet TAKE 1 TABLET BY MOUTH AT BEDTIME AS NEEDED FOR ANXIETY FOR HUMIRA TREATMENT  . hyoscyamine (LEVSIN SL) 0.125 MG SL tablet Place 1 tablet (0.125 mg total) under the tongue every 4 (four) hours as needed.  Marland Kitchen ketorolac (TORADOL) 10 MG tablet Take 1 tablet (10 mg total) by mouth every 6 (six) hours as needed.  . ondansetron (ZOFRAN ODT) 4 MG disintegrating tablet Take 1 tablet (4 mg total) by mouth every 8 (eight) hours as needed for nausea or vomiting.  Marland Kitchen oxyCODONE-acetaminophen (PERCOCET/ROXICET) 5-325 MG tablet Take 1 tablet by mouth every 6 (six) hours as needed for severe pain.  . potassium chloride (K-DUR) 10  MEQ tablet Take 1 tablet (10 mEq total) by mouth daily.  Marland Kitchen sulfamethoxazole-trimethoprim (BACTRIM DS,SEPTRA DS) 800-160 MG tablet Take 1 tablet by mouth every 12 (twelve) hours. Take for 10 days. Started on 08-11-15 per patient   No facility-administered encounter medications on file as of 10/26/2016.     Allergies as of 10/26/2016  . (No Known Allergies)    Past Medical History:  Diagnosis Date  . Complication of anesthesia    "mental recovery"  . Crohn's disease [facility])     Past Surgical History:  Procedure Laterality Date  . APPENDECTOMY    . COLON SURGERY  2012  . LAPAROTOMY  02/27/2011   Procedure: EXPLORATORY LAPAROTOMY;  Surgeon: Iona Coach, MD;  Location: [facility];  Service: General;  Laterality: N/A;  ileocecotomy    Family History  Problem Relation Age of Onset  . Skin cancer Mother   . Cancer Mother        melanoma    Social History   Social History  . Marital status: Married    Spouse name: N/A  . Number of children: 0  . Years of education: N/A   Occupational History  . Works for the [employer]   .  [employer]   Social History Main Topics  . Smoking status: Never Smoker  . Smokeless tobacco: Never Used  . Alcohol use No  . Drug use: No  . Sexual activity: No   Other Topics Concern  . Not on file  Social History Narrative  . No narrative on file      Review of systems: Review of Systems  Constitutional: Negative for fever and chills.  HENT: Negative.   Eyes: Negative for blurred vision.  Respiratory: Negative for cough, shortness of breath and wheezing.   Cardiovascular: Negative for chest pain and palpitations.  Gastrointestinal: as per HPI Genitourinary: Negative for dysuria, urgency, frequency and hematuria.  Musculoskeletal: Negative for myalgias, back pain and joint pain.  Skin: Negative for itching and rash.  Neurological: Negative for dizziness, tremors, focal weakness, seizures and loss of consciousness.    Endo/Heme/Allergies: Positive for seasonal allergies.  Psychiatric/Behavioral: Negative for depression, suicidal ideas and hallucinations.  All other systems reviewed and are negative.   Physical Exam: Vitals:   10/26/16 1509  BP: 104/84  Pulse: 96   Body mass index is 25.5 kg/m. 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 Greater than 50% of the time used for counseling as well as treatment plan and follow-up. He had multiple questions which were answered to his satisfaction  K. Scherry Ran , MD [phone] Mon-Fri 8a-5p [phone] after 5p, weekends, holidays  CC: Marcine Matar.*

## 2016-10-28 LAB — QUANTIFERON TB GOLD ASSAY (BLOOD)
INTERFERON GAMMA RELEASE ASSAY: NEGATIVE
Mitogen-Nil: 9.72 IU/mL
Quantiferon Nil Value: 0.08 IU/mL

## 2016-11-07 ENCOUNTER — Telehealth: Payer: Self-pay | Admitting: Gastroenterology

## 2016-11-07 NOTE — Telephone Encounter (Signed)
Called the pharmacy spoke with Jobe. Confirmed the change of dosing to every 14 days.

## 2016-11-08 NOTE — Telephone Encounter (Signed)
Spoke with [employer] rep. The Humira has been approved for 6 months. Recorded in Cover My Meds Key: [id number] - [place] Case ID: [id number]

## 2016-11-14 ENCOUNTER — Other Ambulatory Visit: Payer: 59

## 2016-11-14 DIAGNOSIS — K5010 Crohn's disease of large intestine without complications: Secondary | ICD-10-CM | POA: Diagnosis not present

## 2016-11-15 LAB — OVA AND PARASITE EXAMINATION: OP: NONE SEEN

## 2016-12-04 ENCOUNTER — Ambulatory Visit: Payer: 59 | Admitting: Gastroenterology

## 2016-12-07 ENCOUNTER — Other Ambulatory Visit: Payer: Self-pay | Admitting: Gastroenterology

## 2016-12-20 DIAGNOSIS — L03311 Cellulitis of abdominal wall: Secondary | ICD-10-CM | POA: Diagnosis not present

## 2016-12-22 ENCOUNTER — Observation Stay (HOSPITAL_COMMUNITY)
Admission: EM | Admit: 2016-12-22 | Discharge: 2016-12-23 | Disposition: A | Discharge status: Home / Self Care | Payer: 59 | Attending: Oncology | Admitting: Oncology

## 2016-12-22 ENCOUNTER — Encounter (HOSPITAL_COMMUNITY): Payer: Self-pay

## 2016-12-22 DIAGNOSIS — S30861A Insect bite (nonvenomous) of abdominal wall, initial encounter: Secondary | ICD-10-CM | POA: Insufficient documentation

## 2016-12-22 DIAGNOSIS — W57XXXA Bitten or stung by nonvenomous insect and other nonvenomous arthropods, initial encounter: Secondary | ICD-10-CM | POA: Insufficient documentation

## 2016-12-22 DIAGNOSIS — T148XXA Other injury of unspecified body region, initial encounter: Secondary | ICD-10-CM | POA: Diagnosis present

## 2016-12-22 DIAGNOSIS — K5090 Crohn's disease, unspecified, without complications: Secondary | ICD-10-CM | POA: Diagnosis not present

## 2016-12-22 DIAGNOSIS — L0390 Cellulitis, unspecified: Secondary | ICD-10-CM | POA: Diagnosis present

## 2016-12-22 DIAGNOSIS — L03311 Cellulitis of abdominal wall: Secondary | ICD-10-CM | POA: Diagnosis not present

## 2016-12-22 LAB — I-STAT CHEM 8, ED
BUN: 10 mg/dL (ref 6–20)
CALCIUM ION: 1.14 mmol/L — AB (ref 1.15–1.40)
Chloride: 101 mmol/L (ref 101–111)
Creatinine, Ser: 1 mg/dL (ref 0.61–1.24)
Glucose, Bld: 105 mg/dL — ABNORMAL HIGH (ref 65–99)
HEMATOCRIT: 46 % (ref 39.0–52.0)
HEMOGLOBIN: 15.6 g/dL (ref 13.0–17.0)
Potassium: 3.7 mmol/L (ref 3.5–5.1)
SODIUM: 138 mmol/L (ref 135–145)
TCO2: 26 mmol/L (ref 22–32)

## 2016-12-22 LAB — CBC WITH DIFFERENTIAL/PLATELET
BASOS PCT: 0 %
Basophils Absolute: 0 10*3/uL (ref 0.0–0.1)
EOS ABS: 0.4 10*3/uL (ref 0.0–0.7)
EOS PCT: 3 %
HCT: 43.9 % (ref 39.0–52.0)
Hemoglobin: 15.2 g/dL (ref 13.0–17.0)
LYMPHS ABS: 2.8 10*3/uL (ref 0.7–4.0)
Lymphocytes Relative: 20 %
MCH: 28.6 pg (ref 26.0–34.0)
MCHC: 34.6 g/dL (ref 30.0–36.0)
MCV: 82.7 fL (ref 78.0–100.0)
MONOS PCT: 8 %
Monocytes Absolute: 1.2 10*3/uL — ABNORMAL HIGH (ref 0.1–1.0)
Neutro Abs: 9.8 10*3/uL — ABNORMAL HIGH (ref 1.7–7.7)
Neutrophils Relative %: 69 %
PLATELETS: 230 10*3/uL (ref 150–400)
RBC: 5.31 MIL/uL (ref 4.22–5.81)
RDW: 12.6 % (ref 11.5–15.5)
WBC: 14.3 10*3/uL — ABNORMAL HIGH (ref 4.0–10.5)

## 2016-12-22 LAB — I-STAT CG4 LACTIC ACID, ED: LACTIC ACID, VENOUS: 1.39 mmol/L (ref 0.5–1.9)

## 2016-12-22 MED ORDER — VANCOMYCIN HCL 10 G IV SOLR
1500.0000 mg | Freq: Two times a day (BID) | INTRAVENOUS | Status: DC
  Administered 2016-12-23 (×2): 1500 mg via INTRAVENOUS
  Filled 2016-12-22 (×2): qty 1500

## 2016-12-22 MED ORDER — IBUPROFEN 400 MG PO TABS
400.0000 mg | ORAL_TABLET | Freq: Four times a day (QID) | ORAL | Status: DC | PRN
  Administered 2016-12-22: 400 mg via ORAL
  Filled 2016-12-22: qty 1

## 2016-12-22 MED ORDER — SODIUM CHLORIDE 0.9 % IV BOLUS (SEPSIS)
1000.0000 mL | Freq: Once | INTRAVENOUS | Status: AC
Start: 2016-12-22 — End: 2016-12-22
  Administered 2016-12-22: 1000 mL via INTRAVENOUS

## 2016-12-22 MED ORDER — ACETAMINOPHEN 325 MG PO TABS
650.0000 mg | ORAL_TABLET | Freq: Four times a day (QID) | ORAL | Status: DC | PRN
Start: 2016-12-22 — End: 2016-12-23

## 2016-12-22 MED ORDER — ENOXAPARIN SODIUM 40 MG/0.4ML ~~LOC~~ SOLN
40.0000 mg | SUBCUTANEOUS | Status: DC
  Administered 2016-12-22: 40 mg via SUBCUTANEOUS
  Filled 2016-12-22: qty 0.4

## 2016-12-22 MED ORDER — CHOLESTYRAMINE LIGHT 4 G PO PACK
4.0000 g | PACK | Freq: Two times a day (BID) | ORAL | Status: DC
  Filled 2016-12-22 (×2): qty 1

## 2016-12-22 MED ORDER — VANCOMYCIN HCL 10 G IV SOLR
1500.0000 mg | Freq: Once | INTRAVENOUS | Status: AC
  Administered 2016-12-22: 1500 mg via INTRAVENOUS
  Filled 2016-12-22: qty 1500

## 2016-12-22 MED ORDER — KETOROLAC TROMETHAMINE 15 MG/ML IJ SOLN
15.0000 mg | Freq: Three times a day (TID) | INTRAMUSCULAR | Status: DC | PRN
  Administered 2016-12-22: 15 mg via INTRAVENOUS
  Filled 2016-12-22: qty 1

## 2016-12-22 MED ORDER — ACETAMINOPHEN 650 MG RE SUPP: 650.0000 mg | Freq: Four times a day (QID) | RECTAL | Status: DC | PRN

## 2016-12-22 NOTE — ED Triage Notes (Signed)
Per Pt, Pt is coming from home with complaints of insect bite noted to his right lower abdomen. Pt reports being seen at [facility] and taking medications as prescribed, but the erythema has noted to be spreading. Tenderness at the site of bite.

## 2016-12-22 NOTE — H&P (Signed)
Date: 12/22/2016               Patient Name:  Robert Jenkins. MRN: [id number]  DOB: [birth year] Age / Sex: 36 y.o., male   PCP: Marcine Matar., MD              Medical Service: Internal Medicine Teaching Service              Attending Physician: Dr. Cyndie Chime Genene Churn, MD    First Contact: [name], MS 4 Pager: [phone]  Second Contact: Dr. Nyra Market Pager: [phone]            After Hours (After 5p/  First Contact Pager: [phone]  weekends / holidays): Second Contact Pager: [phone]   Chief Complaint: skin infection   History of Present Illness: Pt is a 36 yo M with history of Crohn's s/p transverse ileus-R hemicolectomy in 2012 now well-controlled on Humira who presents with five days of enlarging R abdominal erythema and pain.  Pt states he was helping to build a dock last weekend and noticed on Sunday night that he had a "bug bite" on his R inner forearm and lower abdomen.  He mentioned that his brother was bitten by fire ants working on the same project.  On Monday there was erythema surrounding the bite on his abdomen, which has been expanding ever since.  The bite on his L forearm drained pus on Monday, but the abdominal bite has not had any drainage.  He went to Urgent Care on Wednesday because he felt tired and the erythema surrounding the bite on his abdomen continued to expand.  He was prescribed Keflex and Bactrim at that time but the erythema has continued to expand.  The areas of erythema are painful and warm to the touch.  He also developed a headache yesterday that was only mildly relieved with Advil and Tylenol.  It was not associated with visual changes or neck stiffness.  Headache has since resolved.  Patient denies any other symptoms - no fevers, chills, SOB, chest pain, palpitations, nausea, vomiting, diarrhea.    Pt does have a history of Crohn's disease, well-controlled on Humira 40mg  every two weeks.  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  He has 1-2 BMs per day and takes cholestyramine once per day.  Has not noticed any blood or mucus in BMs, no diarrhea.  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  He lives at home with his wife and 3yo daughter.  He does not smoke or use drugs.  He drinks socially, last drink 2-3 weeks ago.    Meds: Current Facility-Administered Medications  Medication Dose Route Frequency Provider Last Rate Last Dose  . acetaminophen (TYLENOL) tablet 650 mg  650 mg Oral Q6H PRN Nyra Market, MD       Or  . acetaminophen (TYLENOL) suppository 650 mg  650 mg Rectal Q6H PRN Nyra Market, MD      . Melene Muller ON 12/23/2016] cholestyramine light (PREVALITE) packet 4 g  4 g Oral BID AC Elnora Quizon, MD      . enoxaparin (LOVENOX) injection 40 mg  40 mg Subcutaneous Q24H Schae Cando, MD      . ibuprofen (ADVIL,MOTRIN) tablet 400 mg  400 mg Oral Q6H PRN Nyra Market, MD      . Melene Muller ON 12/23/2016] vancomycin (VANCOCIN) 1,500 mg in sodium chloride 0.9 %  500 mL IVPB  1,500 mg Intravenous Q12H Mancheril, Candis Schatz, [facility]       Current Outpatient Prescriptions  Medication Sig Dispense Refill  . ALPRAZolam (XANAX) 1 MG tablet TAKE 1 TABLET BY MOUTH AT BEDTIME AS NEEDED FOR ANXIETY FOR HUMIRA TREATMENT 15 tablet 2  . cholestyramine light (PREVALITE) 4 g packet Take 1 packet (4 g total) by mouth 2 (two) times daily. Before breakfast and dinner 60 packet 3  . HUMIRA PEN 40 MG/0.8ML PNKT INJECT 1 PEN UNDER THE SKIN EVERY WEEK 2 each 0  . hyoscyamine (LEVSIN SL) 0.125 MG SL tablet Place 1 tablet (0.125 mg total) under the tongue every 4 (four) hours as needed. 30 tablet 6  . valACYclovir (VALTREX) 1000 MG tablet as needed.  3    Allergies: Allergies as of 12/22/2016  . (No Known Allergies)   Past Medical History:  Diagnosis Date  . Complication of anesthesia    "mental recovery"  . Cro[facility]'s disease [facility])    Past Surgical History:    Procedure Laterality Date  . APPENDECTOMY    . COLON SURGERY  2012  . LAPAROTOMY  02/27/2011   Procedure: EXPLORATORY LAPAROTOMY;  Surgeon: Iona Coach, MD;  Location: [facility] ORS;  Service: General;  Laterality: N/A;  ileocecotomy  . MOLE REMOVAL     precancerous   Family History  Problem Relation Age of Onset  . Skin cancer Mother   . Cancer Mother        melanoma   Social History   Social History  . Marital status: Married    Spouse name: N/A  . Number of children: 1  . Years of education: N/A   Occupational History  . Works for the [employer]   .  [employer]   Social History Main Topics  . Smoking status: Never Smoker  . Smokeless tobacco: Never Used  . Alcohol use No  . Drug use: No  . Sexual activity: No   Other Topics Concern  . Not on file   Social History Narrative  . No narrative on file    Review of Systems: Pertinent items are noted in HPI.  Physical Exam: Blood pressure 103/71, pulse 79, temperature 97.6 F (36.4 C), temperature source Oral, resp. rate 18, height 5\' 9"  (1.753 m), weight 74.8 kg (165 lb), SpO2 100 %. General: Normal weight male; NAD.  Calm, cooperative.  HEENT: PERRLA.  No tonsillar erythema or exudate.   Respiratory: Normal WOB.  Lung fields CTAB.  No wheezing or rhonchi.  CV: RRR.  No murmurs, rubs or gallops.  Abdomen: Soft, nondistended. Normoactive bowel sounds.  Indurated bite mark with large area of surrounding erythema on central-to-right lower abdomen, approximately 12cm wide by 4cm high.  Tender and warm to touch.  Pt has circumferential pen markings denoting spread of erythema over past two days - area has expanded. No drainage.  Skin: Warm, dry.  Indurated bite mark over lower R forearm, tender to touch.  Indurated bite mark with large area of surrounding erythema on central-to-right lower abdomen, approximately 12cm wide by 4cm high.  Tender and warm to touch.  Pt has circumferential pen markings denoting spread  of erythema over past two days - area has expanded. No drainage.  Extremities: No lower extremity edema  Neuro: Moves all 4 limbs freely.  Psych: Not anxious or depressed appearing  Lab results: CBC    Component Value Date/Time   WBC 14.3 (H) 12/22/2016 1140   RBC  5.31 12/22/2016 1140   HGB 15.6 12/22/2016 1148   HCT 46.0 12/22/2016 1148   PLT 230 12/22/2016 1140   MCV 82.7 12/22/2016 1140   MCH 28.6 12/22/2016 1140   MCHC 34.6 12/22/2016 1140   RDW 12.6 12/22/2016 1140   LYMPHSABS 2.8 12/22/2016 1140   MONOABS 1.2 (H) 12/22/2016 1140   EOSABS 0.4 12/22/2016 1140   BASOSABS 0.0 12/22/2016 1140  '   Ref Range & Units 11:48 (12/22/16)   Sodium 135 - 145 mmol/L 138    Potassium 3.5 - 5.1 mmol/L 3.7    Chloride 101 - 111 mmol/L 101    BUN 6 - 20 mg/dL 10    Creatinine, Ser 7.42 - 1.24 mg/dL 5.95    Glucose, Bld 65 - 99 mg/dL 638     Calcium, Ion 7.56 - 1.40 mmol/L 1.14     TCO2 22 - 32 mmol/L 26    Hemoglobin 13.0 - 17.0 g/dL 43.3    HCT 29.5 - 18.8 % 46.0     Assessment & Plan by Problem: Cellulitis: Pt presents with 5 days of expanding erythema surrounding a bug bite, which has not been responsive to three days of Bactrim and Keflex.  The area is warm and tender to touch, but non-purulent.  This appears to be cellulitis unresponsive to PO Bactrim and Keflex.  Possible that this is a MRSA infection, and given pt's immunocompromised status (uses Humira for Crohn's disease), pt was started on vancomycin.  He does not have any systemic signs of infection - pt has been afebrile, VS stable, with a mild WBC count of 14.3.  [place] of area does not show any areas that can be drained.  Will plan to continue vancomycin and monitor overnight for response to IV abx.   -IV vancomycin  --> reassess erythema tomorrow  -repeat CBC, BMP in AM  -HIV Ab screen -APAP/Ibuprofen PRN for pain      Crohn's disease: Pt has history of Crohn's disease, s/p terminal ileum - R hemicolectomy resection in 2012.   Had been on Humira 40mg  weekly up until two months ago when transitioned down to Humira 40mg  every two weeks given good symptom control.  Pt has 1-2 normal-formed BMs per day and takes cholestyramine.  He is due for a Humira injection tomorrow and requires Xanax 1mg  prior to injection due to needle phobia.  He will have his wife bring Humira for tomorrow's injection, as pharmacy unable to provide. -Humira injection tomorrow (will bring from home) with 1mg  Xanax prior to injection -cholestyramine 4g twice daily with meals   Diet: Regular PPX: Lovenox  Code: Full   This is a Psychologist, occupational Note.  The care of the patient was discussed with Dr. Nyra Market and the assessment and plan was formulated with their assistance.  Please see their note for official documentation of the patient encounter.   Signed: Nathaneil Canary, Medical Student 12/22/2016, 3:07 PM   I have seen and examined the patient myself, and I have reviewed the note by [name], MSIV and was present during the interview and physical exam. I have reviewed and edited the above note as appropriate. Agree with above.   Nyra Market, MD IMTS - PGY 2 [phone]

## 2016-12-22 NOTE — ED Provider Notes (Signed)
[facility] DEPT Provider Note   CSN: [id number] Arrival date & time: 12/22/16  0830     History   Chief Complaint Chief Complaint  Patient presents with  . Insect Bite    HPI Robert Jenkins. is a 36 y.o. male.  HPI Patient presents with 2-3 day history of redness and swelling on his right abdomen. He got seen at urgent care and started on Bactrim and Keflex. Since then it has swollen more. More painful. Has had some mild headache but no frank fevers or chills. No real drainage. He is on Humira for his Crohn's disease. He has had a previous cellulitic or abscess infection on his right wrist. He is on Humira but is due for a new dose right about now. Past Medical History:  Diagnosis Date  . Complication of anesthesia    "mental recovery"  . Crohn's disease [facility])     Patient Active Problem List   Diagnosis Date Noted  . Abnormal liver function tests 06/21/2011  . 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 APPENDECTOMY    . COLON SURGERY  2012  . LAPAROTOMY  02/27/2011   Procedure: EXPLORATORY LAPAROTOMY;  Surgeon: Willey Blade, MD;  Location: [facility]S;  Service: General;  Laterality: N/A;  ileocecotomy  . MOLE REMOVAL     precancerous       Home Medications    Prior to Admission medications   Medication Sig Start Date End Date Taking? Authorizing Provider  ALPRAZolam (XANAX) 1 MG tablet TAKE 1 TABLET BY MOUTH AT BEDTIME AS NEEDED FOR ANXIETY FOR HUMIRA TREATMENT 09/13/16   Mauri Pole, MD  cholestyramine light (PREVALITE) 4 g packet Take 1 packet (4 g total) by mouth 2 (two) times daily. Before breakfast and dinner 10/26/16   Nandigam, Venia Minks, MD  HUMIRA PEN 40 MG/0.8ML PNKT INJECT 1 PEN UNDER THE SKIN EVERY WEEK 12/07/16   Nandigam, Venia Minks, MD  hyoscyamine (LEVSIN SL) 0.125 MG SL tablet Place 1 tablet (0.125 mg total) under the tongue every 4 (four) hours as needed.  04/16/15   Mauri Pole, MD  valACYclovir (VALTREX) 1000 MG tablet as needed. 09/13/16   [provider]    Family History Family History  Problem Relation Age of Onset  . Skin cancer Mother   . Cancer Mother        melanoma    Social History Social History  Substance Use Topics  . Smoking status: Never Smoker  . Smokeless tobacco: Never Used  . Alcohol use No     Allergies   Patient has no known allergies.   Review of Systems Review of Systems  Constitutional: Negative for fever.  HENT: Negative for congestion.   Respiratory: Negative for cough.   Cardiovascular: Negative for chest pain.  Gastrointestinal: Negative for abdominal pain.  Genitourinary: Negative for flank pain.  Musculoskeletal: Negative for back pain.  Skin: Positive for color change and wound.  Neurological: Positive for headaches.  Psychiatric/Behavioral: Negative for confusion.     Physical Exam Updated Vital Signs BP 111/70 (BP Location: Left Arm)   Pulse (!) 106   Temp 97.6 F (36.4 C) (Oral)   Resp 18   Ht 5' 9"  (1.753 m)   Wt 74.8 kg (165 lb)   SpO2 98%   BMI 24.37 kg/m   Physical Exam  Constitutional: He appears well-developed.  HENT:  Head: Normocephalic.  Eyes:  Pupils are equal, round, and reactive to light.  Cardiovascular:  Mild tachycardia  Pulmonary/Chest: Effort normal.  Abdominal: There is tenderness.  Right anterior lower abdominal wall has erythematous and swollen area. There is a central indurated area is approximately 5 cm across. There is a larger erythematous area that is around 13 cm across. There is no fluctuance. There are previous markings from the extent of the erythema. It has increased since his previous markings.  Musculoskeletal: He exhibits no edema.  Neurological: He is alert.  Skin: Skin is warm. Capillary refill takes less than 2 seconds.     ED Treatments / Results  Labs (all labs ordered are listed, but only abnormal results are  displayed) Labs Reviewed  CBC WITH DIFFERENTIAL/PLATELET - Abnormal; Notable for the following:       Result Value   WBC 14.3 (*)    Neutro Abs 9.8 (*)    Monocytes Absolute 1.2 (*)    All other components within normal limits  I-STAT CHEM 8, ED - Abnormal; Notable for the following:    Glucose, Bld 105 (*)    Calcium, Ion 1.14 (*)    All other components within normal limits  I-STAT CG4 LACTIC ACID, ED    EKG  EKG Interpretation None       Radiology No results found.  Procedures Procedures (including critical care time)  Medications Ordered in ED Medications  sodium chloride 0.9 % bolus 1,000 mL (1,000 mLs Intravenous New Bag/Given 12/22/16 1143)  vancomycin (VANCOCIN) 1,500 mg in sodium chloride 0.9 % 500 mL IVPB (not administered)  vancomycin (VANCOCIN) 1,500 mg in sodium chloride 0.9 % 500 mL IVPB (not administered)     Initial Impression / Assessment and Plan / ED Course  I have reviewed the triage vital signs and the nursing notes.  Pertinent labs & imaging results that were available during my care of the patient were reviewed by me and considered in my medical decision making (see chart for details).     Patient with cellulitis of abdomen. Has been on Bactrim and Keflex for 2 days and is still continued to worsen. No clear fluctuance seen and no large fluid collection on bedside ultrasound. He is on some immunosuppression and I think with the antibiotics and increasing size or benefit from observation for IV antibiotics and continued monitoring. Will admit to unassigned medicine  Final Clinical Impressions(s) / ED Diagnoses   Final diagnoses:  Cellulitis of abdominal wall    New Prescriptions New Prescriptions   No medications on file     Davonna Belling, MD 12/22/16 1231

## 2016-12-22 NOTE — Progress Notes (Signed)
Pharmacy Antibiotic Note  Robert Jenkins. is a 36 y.o. male admitted on 12/22/2016 with cellulitis from an insect bite.  Pharmacy has been consulted for vancomycin dosing.   Plan: -Vancomycin 1500 mg IV Q 12 hours -Monitor CBC, renal fx, cultures and clinical progress -VT at SS   Height: 5' 9"  (175.3 cm) Weight: 165 lb (74.8 kg) IBW/kg (Calculated) : 70.7  Temp (24hrs), Avg:97.6 F (36.4 C), Min:97.6 F (36.4 C), Max:97.6 F (36.4 C)   Recent Labs Lab 12/22/16 1148  CREATININE 1.00  LATICACIDVEN 1.39    Estimated Creatinine Clearance: 102.1 mL/min (by C-G formula based on SCr of 1 mg/dL).    No Known Allergies  Antimicrobials this admission: Vancomycin 10/5 >>   Dose adjustments this admission: None  Microbiology results:   Thank you for allowing pharmacy to be a part of this patient's care.  Albertina Parr, PharmD., BCPS Clinical Pharmacist Pager [phone]

## 2016-12-23 DIAGNOSIS — L03311 Cellulitis of abdominal wall: Secondary | ICD-10-CM | POA: Diagnosis not present

## 2016-12-23 DIAGNOSIS — K5090 Crohn's disease, unspecified, without complications: Secondary | ICD-10-CM | POA: Diagnosis not present

## 2016-12-23 LAB — BASIC METABOLIC PANEL
Anion gap: 7 (ref 5–15)
BUN: 7 mg/dL (ref 6–20)
CO2: 25 mmol/L (ref 22–32)
Calcium: 8.5 mg/dL — ABNORMAL LOW (ref 8.9–10.3)
Chloride: 105 mmol/L (ref 101–111)
Creatinine, Ser: 0.91 mg/dL (ref 0.61–1.24)
GFR calc Af Amer: >60 mL/min (ref >60)
GFR calc non Af Amer: >60 mL/min (ref >60)
Glucose, Bld: 92 mg/dL (ref 65–99)
Potassium: 3.7 mmol/L (ref 3.5–5.1)
Sodium: 137 mmol/L (ref 135–145)

## 2016-12-23 LAB — CBC
HCT: 40.6 % (ref 39.0–52.0)
Hemoglobin: 13.3 g/dL (ref 13.0–17.0)
MCH: 27.2 pg (ref 26.0–34.0)
MCHC: 32.8 g/dL (ref 30.0–36.0)
MCV: 83 fL (ref 78.0–100.0)
Platelets: 204 10*3/uL (ref 150–400)
RBC: 4.89 MIL/uL (ref 4.22–5.81)
RDW: 12.7 % (ref 11.5–15.5)
WBC: 11.3 10*3/uL — ABNORMAL HIGH (ref 4.0–10.5)

## 2016-12-23 LAB — HIV ANTIBODY (ROUTINE TESTING W REFLEX): HIV SCREEN 4TH GENERATION: NONREACTIVE

## 2016-12-23 MED ORDER — DOXYCYCLINE HYCLATE 50 MG PO CAPS: 50.0000 mg | ORAL_CAPSULE | Freq: Two times a day (BID) | ORAL | 0 refills | Status: AC

## 2016-12-23 NOTE — Discharge Summary (Signed)
Name: Robert Jenkins. MRN: [id number] DOB: [birth year] 36 y.o. PCP: Marcine Matar., MD  Date of Admission: 12/22/2016 10:34 AM Date of Discharge: 12/23/2016 Attending Physician: Levert Feinstein, MD  Discharge Diagnosis: 1. Cellulitis 2. Crohn disease   Discharge Medications: Allergies as of 12/23/2016   No Known Allergies     Medication List    STOP taking these medications   cephALEXin 500 MG capsule Commonly known as:  KEFLEX   hyoscyamine 0.125 MG SL tablet Commonly known as:  LEVSIN SL   praziquantel 600 MG tablet Commonly known as:  BILTRICIDE   sulfamethoxazole-trimethoprim 800-160 MG tablet Commonly known as:  BACTRIM DS,SEPTRA DS     TAKE these medications   doxycycline 50 MG capsule Commonly known as:  VIBRAMYCIN Take 1 capsule (50 mg total) by mouth 2 (two) times daily.   HUMIRA PEN 40 MG/0.8ML Pnkt Generic drug:  Adalimumab INJECT 1 PEN UNDER THE SKIN EVERY WEEK What changed:  See the new instructions.   ibuprofen 200 MG tablet Commonly known as:  ADVIL,MOTRIN Take 400 mg by mouth every 6 (six) hours as needed for headache or mild pain.        Disposition and follow-up:   Mr.Robert Jenkins. was discharged from [facility] in Stable condition.  At the hospital follow up visit please address:  Cellulitis: Pt presented with to ED one week of non-purulent cellulitis surrounding an insect bite on his abdomen, which continued to enlarge despite 3 days of Keflex and Bactrim.  No signs of systemic infection.  Pt was given IV vancomycin x1 day while in the hospital, with improvement of symptoms.  He was discharged after one day in the hospital on PO doxycycline 100mg  BID to complete a 14-day course. Please ensure resolution of his symptoms.  -Doxycycline 100mg  BID for 14 days   -Follow-up [name] results  -Follow-up resolution of symptoms  Crohn's disease: Stable on Humira 40mg   every-other-week.  Cholestyramine helps with BM. -Continue on Humira 40mg  every-other-week -Cholestyramine BID for bowel movements   2.  Labs / imaging needed at time of follow-up: none  3.  Pending labs/ test needing follow-up: [facility] Spotted Fever lab results   Follow-up Appointments: Follow up with PCP as needed   Hospital Course by problem list: Cellulitis: Pt presented to ED with 5 days of expanding erythema surrounding a bug bite, which was not responsive to three days of Bactrim and Keflex.  The area was warm and tender to touch, but non-purulent.  He did not have any systemic signs of infection, pt afebrile with mild WBC count of 14.3  He was started on IV vancomycin and responded well.  The following morning the area of cellulitis had decreased, and skin was much less tender to touch with WBC count of 11.3.  Pt remained afebrile.  Given improvement in cellulitis and no signs of systemic infection, pt was deemed safe for discharge on doxycycline 100mg  BID x14 days.  [name] was ordered and pending.  Pt should follow up with PCP if cellulitis does not fully resolve or he develops any signs of systemic infection.  Crohn's disease: Pt has history of Crohn's disease, s/p terminal ileum - R hemicolectomy resection in 2012.  He takes Humira 40mg  every two weeks with good symptom control.  He also takes cholesytramine BID for BMs.  No changes to these medications were made while in the hospital, and  he should continue his home regimen at discharge.   Discharge Vitals:   BP 118/66 (BP Location: Right Arm)   Pulse 63   Temp 98.1 F (36.7 C) (Oral)   Resp 18   Ht 5\' 9"  (1.753 m)   Wt 165 lb (74.8 kg)   SpO2 96%   BMI 24.37 kg/m   Pertinent Labs, Studies, and Procedures:  CBC Latest Ref Rng & Units 12/23/2016 12/22/2016 12/22/2016  WBC 4.0 - 10.5 K/uL 11.3(H) - 14.3(H)  Hemoglobin 13.0 - 17.0 g/dL 57.8 46.9 62.9  Hematocrit 39.0 - 52.0 % 40.6 46.0 43.9    Platelets 150 - 400 K/uL 204 - 230   BMP Latest Ref Rng & Units 12/23/2016 12/22/2016  Glucose 65 - 99 mg/dL 92 528(U)  BUN 6 - 20 mg/dL 7 10  Creatinine 1.32 - 1.24 mg/dL 4.40 1.02  Sodium 725 - 145 mmol/L 137 138  Potassium 3.5 - 5.1 mmol/L 3.7 3.7  Chloride 101 - 111 mmol/L 105 101  CO2 22 - 32 mmol/L 25 -  Calcium 8.9 - 10.3 mg/dL 3.6(U) -   HIV: Nonreactive  RMSF: pending   Discharge Instructions: You were hospitalized for cellulitis (a skin infection) for which you received IV antibiotics.  There are no signs of the infection spreading to your bloodstream.  You are being discharged on an oral antibiotic, doxycycline 100mg , which you will take twice per day for the next 14 days.  This should help resolve your infection.  If the infection spreads or does not go away, or if you develop any fevers or chills, please return to your primary care doctor or the ED for evaluation.   Discharge Instructions    Call MD for:  difficulty breathing, headache or visual disturbances    Complete by:  As directed    Call MD for:  extreme fatigue    Complete by:  As directed    Call MD for:  hives    Complete by:  As directed    Call MD for:  persistant dizziness or light-headedness    Complete by:  As directed    Call MD for:  persistant nausea and vomiting    Complete by:  As directed    Call MD for:  redness, tenderness, or signs of infection (pain, swelling, redness, odor or green/yellow discharge around incision site)    Complete by:  As directed    Call MD for:  severe uncontrolled pain    Complete by:  As directed    Call MD for:  temperature >100.4    Complete by:  As directed    Diet - low sodium heart healthy    Complete by:  As directed    Discharge instructions    Complete by:  As directed    Take doxycycline one tab twice daily until you finish the bottle.  Please follow up with your primary care provider in about 1 week to evaluate your healing. If you start developing fevers  or your site of infection worsens, please get evaluated earlier by your PCP or the ED.   Increase activity slowly    Complete by:  As directed       Signed: Nyra Market, MD 12/23/2016, 2:02 PM

## 2016-12-23 NOTE — Progress Notes (Signed)
Nsg Discharge Note  Admit Date:  12/22/2016 Discharge date: 12/23/2016   Francee Gentile. to be D/C'd Home per MD order.  AVS completed.  Copy for chart, and copy for patient signed, and dated. Patient/caregiver able to verbalize understanding.  Discharge Medication: Allergies as of 12/23/2016   No Known Allergies     Medication List    STOP taking these medications   cephALEXin 500 MG capsule Commonly known as:  KEFLEX   hyoscyamine 0.125 MG SL tablet Commonly known as:  LEVSIN SL   praziquantel 600 MG tablet Commonly known as:  BILTRICIDE   sulfamethoxazole-trimethoprim 800-160 MG tablet Commonly known as:  BACTRIM DS,SEPTRA DS     TAKE these medications   doxycycline 50 MG capsule Commonly known as:  VIBRAMYCIN Take 1 capsule (50 mg total) by mouth 2 (two) times daily.   HUMIRA PEN 40 MG/0.8ML Pnkt Generic drug:  Adalimumab INJECT 1 PEN UNDER THE SKIN EVERY WEEK What changed:  See the new instructions.   ibuprofen 200 MG tablet Commonly known as:  ADVIL,MOTRIN Take 400 mg by mouth every 6 (six) hours as needed for headache or mild pain.       Discharge Assessment: Vitals:   12/23/16 0653 12/23/16 1445  BP: 118/66 108/70  Pulse: 63 83  Resp: 18 14  Temp: 98.1 F (36.7 C) 97.8 F (36.6 C)  SpO2: 96% 98%   Skin clean, dry and intact without evidence of skin break down, no evidence of skin tears noted. IV catheter discontinued intact. Site without signs and symptoms of complications - no redness or edema noted at insertion site, patient denies c/o pain - only slight tenderness at site.  Dressing with slight pressure applied.  D/c Instructions-Education: Discharge instructions given to patient/family with verbalized understanding. D/c education completed with patient/family including follow up instructions, medication list, d/c activities limitations if indicated, with other d/c instructions as indicated by MD - patient able to verbalize understanding, all  questions fully answered. Patient instructed to return to ED, call 911, or call MD for any changes in condition.  Patient escorted via [place], and D/C home via private auto.  Eliot Ford, RN 12/23/2016 4:58 PM

## 2016-12-23 NOTE — Progress Notes (Signed)
Subjective: Pt was sitting in recliner eating breakfast with wife when team entered room this morning.  Pt states he is feeling better and believes the redness is regressing.  Cellulitis is less painful today.  He did take Ibuprofen and Toradol for pain last night, but feels pain is manageable this morning.  He doesn't think the cellulitis is from a tic bite, but rather from fire ants.  He is hoping to be discharged today and wants to know when he can go back to work.  Denied SOB or CP.  All questions answered.   Objective: Vital signs in last 24 hours: Vitals:   12/22/16 1455 12/22/16 1537 12/22/16 2227 12/23/16 0653  BP: 103/71 109/71 99/60 118/66  Pulse: 79 80 72 63  Resp: 18 18 18 18   Temp:  98.2 F (36.8 C) 97.8 F (36.6 C) 98.1 F (36.7 C)  TempSrc:  Oral Oral Oral  SpO2: 100% 100% 99% 96%  Weight:      Height:       General: Normal weight male; NAD.  Calm, cooperative.  Respiratory: Normal WOB.  Lung fields CTAB.  No wheezing or rhonchi.  CV: RRR.  No murmurs, rubs or gallops.  Abdomen: Soft, nondistended. Indurated bite mark with area of surrounding erythema on central-to-right lower abdomen, approximately 10cm wide by 4cm high (decreased surface area from yesterday, appears to be resolving).  Warm to touch but much less tender than yesterday. No drainage.  Skin: Warm, dry.  Indurated bite mark over lower R forearm.  Indurated bite mark with area of surrounding erythema on central-to-right lower abdomen, approximately 10cm wide by 4cm high (decreased surface area from yesterday, appears to be resolving).  Warm to touch but much less tender than yesterday. No drainage.  Extremities: No lower extremity edema  Neuro: Moves all 4 limbs freely.   Medications: I have reviewed the patient's current medications. Scheduled Meds: . cholestyramine light  4 g Oral BID AC  . enoxaparin (LOVENOX) injection  40 mg Subcutaneous Q24H   Continuous Infusions: . vancomycin Stopped (12/23/16  0310)   PRN Meds:.acetaminophen **OR** acetaminophen, ketorolac   Assessment/Plan:  Cellulitis: Cellulitis appears to be responding to vancomycin, and pt remains afebrile with WBC of 11.3 (down from 14.3 yesterday).  On exam, area of cellulitis has decreased from yesterday, and it is much less tender and painful to touch.  Given response, will plan to discharge patient on doxycycline 182m BID for a 14-day course.  He will receive last dose of vanc this PM prior to discharge.  We have added on test for rocky [place] spotted fever, although patient does not think he was bitten by a tick.  -Last dose IV vancomycin this PM, then will discharge on doxy 1038mBID for 14-day course  -RMSF labs pending  -APAP/Ibuprofen PRN for pain      Crohn's disease: Pt due for Humira injection today.  Can wait until discharge to take at home.  -Humira injection today (will bring from home) -cholestyramine 4g twice daily with meals   Diet: Regular PPX: Lovenox  Code: Full   Dispo: Likely discharge today following PM dose of vancomycin.  Will be discharged home with PO doxycycline.  This is a [employer] information officerote.  The care of the patient was discussed with Dr. GoAlphonzo Grievend the assessment and plan formulated with their assistance.  Please see their attached note for official documentation of the daily encounter.   LOS: 1 day   LyRandal BubaMedical Student  12/23/2016, 10:19 AM   I have seen and examined the patient myself, and I have reviewed the note by Devonne Doughty, MSIV and was present during the interview and physical exam. I have reviewed and edited the above note as appropriate. Agree with above.   Alphonzo Grieve, MD IMTS - PGY 2 [phone]

## 2016-12-26 LAB — ROCKY MTN SPOTTED FVR ABS PNL(IGG+IGM)
RMSF IGM: 0.69 {index} (ref 0.00–0.89)
RMSF IgG: NEGATIVE

## 2016-12-31 ENCOUNTER — Other Ambulatory Visit: Payer: Self-pay | Admitting: Gastroenterology

## 2017-01-12 ENCOUNTER — Other Ambulatory Visit: Payer: Self-pay | Admitting: Gastroenterology

## 2017-02-11 ENCOUNTER — Other Ambulatory Visit: Payer: Self-pay | Admitting: Gastroenterology

## 2017-02-13 ENCOUNTER — Ambulatory Visit: Payer: 59 | Admitting: Gastroenterology

## 2017-02-13 ENCOUNTER — Encounter: Payer: Self-pay | Admitting: Gastroenterology

## 2017-02-13 ENCOUNTER — Other Ambulatory Visit: Payer: Self-pay

## 2017-02-13 VITALS — BP 100/70 | HR 72 | Ht 69.0 in | Wt 168.0 lb

## 2017-02-13 DIAGNOSIS — K5080 Crohn's disease of both small and large intestine without complications: Secondary | ICD-10-CM | POA: Diagnosis not present

## 2017-02-13 DIAGNOSIS — F40298 Other specified phobia: Secondary | ICD-10-CM

## 2017-02-13 DIAGNOSIS — K9089 Other intestinal malabsorption: Secondary | ICD-10-CM

## 2017-02-13 DIAGNOSIS — R69 Illness, unspecified: Secondary | ICD-10-CM | POA: Diagnosis not present

## 2017-02-13 MED ORDER — ADALIMUMAB 40 MG/0.4ML ~~LOC~~ PSKT
40.0000 mg | PREFILLED_SYRINGE | SUBCUTANEOUS | 6 refills | Status: DC
Start: 2017-02-13 — End: 2018-04-08

## 2017-02-13 NOTE — Progress Notes (Signed)
Robert Jenkins    [id number]    Jan 28, 1981  Primary Care Physician:No primary care provider on file.  Referring Physician: Marcine Matar., MD [address] [place], [place] [address]  Chief complaint:  Crohn's disease  HPI: Patient here for follow-up visit, reports significant improvement of diarrhea with cholestyramine. He was treated with praziquantel, repeat O&P negative. 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 Patient is currently on Humira 40 mg weekly injection. He is doing well with no complaints. He had stool testing by veterinary professor at [facility] who is a family friend and he tested positive for tape worm. Patient brought the report with him. He he is having 4-5 semi-formed soft bowel movements daily, no significant change. Denies any blood in stool, nausea, vomiting, abdominal pain or melena. Denies any reaction at the injection site.   Last colonoscopy in 2013 showed active inflammation in the site of anastomosis. Patient is afraid of needles and every time he has to take his Humira shot he premedicate himself with Xanax   Outpatient Encounter Medications as of 02/13/2017  Medication Sig  . ALPRAZolam (XANAX) 1 MG tablet TAKE 1 TABLET BY MOUTH AT BEDTIME AS NEEDED FOR ANXIETY FOR HUMIRA TREATMENT  . HUMIRA PEN 40 MG/0.8ML PNKT INJECT 1 PEN UNDER THE SKIN EVERY WEEK  . hyoscyamine (LEVSIN SL) 0.125 MG SL tablet PLACE 1 TABLET (0.125 MG TOTAL) UNDER THE TONGUE EVERY 4 (FOUR) HOURS AS NEEDED.  Marland Kitchen ibuprofen (ADVIL,MOTRIN) 200 MG tablet Take 400  mg by mouth every 6 (six) hours as needed for headache or mild pain.   No facility-administered encounter medications on file as of 02/13/2017.     Allergies as of 02/13/2017  . (No Known Allergies)    Past Medical History:  Diagnosis Date  . Complication of anesthesia    "mental recovery"  . Crohn's disease [facility])     Past Surgical History:  Procedure Laterality Date  . APPENDECTOMY    . COLON SURGERY  2012  . LAPAROTOMY  02/27/2011   Procedure: EXPLORATORY LAPAROTOMY;  Surgeon: Iona Coach, MD;  Location: [facility];  Service: General;  Laterality: N/A;  ileocecotomy  . MOLE REMOVAL     precancerous    Family History  Problem Relation Age of Onset  . Skin cancer Mother   . Cancer Mother        melanoma    Social History   Socioeconomic History  . Marital status: Married    Spouse name: Not on file  . Number of children: 1  . Years of education: Not on file  . Highest education level: Not on file  Social Needs  . Financial resource strain: Not on file  . Food insecurity - worry: Not on file  . Food insecurity - inability: Not on file  . Transportation needs - medical: Not on file  . Transportation needs - non-medical: Not on file  Occupational History  . Occupation: Works for the [employer]: [employer] [place]  Tobacco Use  . Smoking status: Never Smoker  .  Smokeless tobacco: Never Used  Substance and Sexual Activity  . Alcohol use: No  . Drug use: No  . Sexual activity: No  Other Topics Concern  . Not on file  Social History Narrative  . Not on file      Review of systems: Review of Systems  Constitutional: Negative for fever and chills.  HENT: Negative.   Eyes: Negative for blurred vision.  Respiratory: Negative for cough, shortness of breath and wheezing.   Cardiovascular: Negative for chest pain and palpitations.  Gastrointestinal: as per HPI Genitourinary: Negative for dysuria, urgency, frequency and hematuria.    Musculoskeletal: Negative for myalgias, back pain and joint pain.  Skin: Negative for itching and rash.  Neurological: Negative for dizziness, tremors, focal weakness, seizures and loss of consciousness.  Endo/Heme/Allergies: Positive for seasonal allergies.  Psychiatric/Behavioral: Negative for depression, suicidal ideas and hallucinations.  All other systems reviewed and are negative.   Physical Exam: Vitals:   02/13/17 1432  BP: 100/70  Pulse: 72   Body mass index is 24.81 kg/m. 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 Scherry Ran , MD [phone] Mon-Fri 8a-5p [phone] after 5p, weekends, holidays  CC: Marcine Matar.*

## 2017-02-13 NOTE — Patient Instructions (Signed)
Follow up in 6 months 

## 2017-04-09 ENCOUNTER — Other Ambulatory Visit: Payer: Self-pay | Admitting: Gastroenterology

## 2017-04-11 ENCOUNTER — Other Ambulatory Visit: Payer: Self-pay | Admitting: Gastroenterology

## 2017-04-17 DIAGNOSIS — R509 Fever, unspecified: Secondary | ICD-10-CM | POA: Diagnosis not present

## 2017-04-17 DIAGNOSIS — J029 Acute pharyngitis, unspecified: Secondary | ICD-10-CM | POA: Diagnosis not present

## 2017-04-17 DIAGNOSIS — J0300 Acute streptococcal tonsillitis, unspecified: Secondary | ICD-10-CM | POA: Diagnosis not present

## 2017-04-30 ENCOUNTER — Telehealth: Payer: Self-pay | Admitting: Gastroenterology

## 2017-04-30 NOTE — Telephone Encounter (Signed)
PA approval for Humira [id number] good for 6 months from today's date.

## 2017-05-16 ENCOUNTER — Other Ambulatory Visit: Payer: Self-pay | Admitting: Gastroenterology

## 2017-06-25 DIAGNOSIS — R509 Fever, unspecified: Secondary | ICD-10-CM | POA: Diagnosis not present

## 2017-06-25 DIAGNOSIS — J181 Lobar pneumonia, unspecified organism: Secondary | ICD-10-CM | POA: Diagnosis not present

## 2017-08-05 ENCOUNTER — Other Ambulatory Visit: Payer: Self-pay | Admitting: Gastroenterology

## 2017-08-06 ENCOUNTER — Other Ambulatory Visit: Payer: Self-pay

## 2017-08-06 MED ORDER — ALPRAZOLAM 1 MG PO TABS: ORAL_TABLET | ORAL | 2 refills | Status: DC

## 2017-08-19 ENCOUNTER — Other Ambulatory Visit: Payer: Self-pay | Admitting: Gastroenterology

## 2017-10-25 ENCOUNTER — Encounter: Payer: Self-pay | Admitting: Gastroenterology

## 2017-11-01 ENCOUNTER — Other Ambulatory Visit: Payer: Self-pay | Admitting: Gastroenterology

## 2017-11-13 ENCOUNTER — Other Ambulatory Visit: Payer: Self-pay | Admitting: Gastroenterology

## 2017-11-29 ENCOUNTER — Other Ambulatory Visit: Payer: Self-pay

## 2017-11-29 ENCOUNTER — Other Ambulatory Visit (INDEPENDENT_AMBULATORY_CARE_PROVIDER_SITE_OTHER): Payer: 59

## 2017-11-29 ENCOUNTER — Telehealth: Payer: Self-pay

## 2017-11-29 DIAGNOSIS — K5080 Crohn's disease of both small and large intestine without complications: Secondary | ICD-10-CM

## 2017-11-29 LAB — COMPREHENSIVE METABOLIC PANEL
ALT: 48 U/L (ref 0–53)
AST: 21 U/L (ref 0–37)
Albumin: 4.3 g/dL (ref 3.5–5.2)
Alkaline Phosphatase: 60 U/L (ref 39–117)
BILIRUBIN TOTAL: 0.4 mg/dL (ref 0.2–1.2)
BUN: 18 mg/dL (ref 6–23)
CO2: 30 mEq/L (ref 19–32)
CREATININE: 1.01 mg/dL (ref 0.40–1.50)
Calcium: 9.4 mg/dL (ref 8.4–10.5)
Chloride: 103 mEq/L (ref 96–112)
GFR: 88.19 mL/min (ref >60.00)
GLUCOSE: 106 mg/dL — AB (ref 70–99)
Potassium: 3.6 mEq/L (ref 3.5–5.1)
Sodium: 139 mEq/L (ref 135–145)
Total Protein: 7.8 g/dL (ref 6.0–8.3)

## 2017-11-29 LAB — CBC WITH DIFFERENTIAL/PLATELET
BASOS PCT: 1.1 % (ref 0.0–3.0)
Basophils Absolute: 0.1 10*3/uL (ref 0.0–0.1)
EOS ABS: 1.1 10*3/uL — AB (ref 0.0–0.7)
Eosinophils Relative: 11.2 % — ABNORMAL HIGH (ref 0.0–5.0)
HCT: 43.3 % (ref 39.0–52.0)
HEMOGLOBIN: 15 g/dL (ref 13.0–17.0)
Lymphocytes Relative: 36.8 % (ref 12.0–46.0)
Lymphs Abs: 3.6 10*3/uL (ref 0.7–4.0)
MCHC: 34.6 g/dL (ref 30.0–36.0)
MCV: 81.3 fl (ref 78.0–100.0)
MONO ABS: 0.8 10*3/uL (ref 0.1–1.0)
Monocytes Relative: 7.8 % (ref 3.0–12.0)
NEUTROS PCT: 43.1 % (ref 43.0–77.0)
Neutro Abs: 4.2 10*3/uL (ref 1.4–7.7)
Platelets: 271 10*3/uL (ref 150.0–400.0)
RBC: 5.33 Mil/uL (ref 4.22–5.81)
RDW: 13.4 % (ref 11.5–15.5)
WBC: 9.8 10*3/uL (ref 4.0–10.5)

## 2017-11-29 NOTE — Telephone Encounter (Signed)
Patient is delinquent on his follow up visits and his labs. I am trying to get insurance approval for the Humira. The patient is scheduled for a return appointment in October. He is due his Humira in 2 weeks. Insurance has denied payment of the Humira with one reason being he last had TB screening in August 2018.  Can he have labs and TB screening prior to his October appointment?

## 2017-11-30 NOTE — Telephone Encounter (Signed)
Please request CBC, CMP, CRP, TB quantiferon, B12, folate and ferritin level. Schedule follow up visit soon. Thanks

## 2017-11-30 NOTE — Telephone Encounter (Signed)
Ok

## 2017-11-30 NOTE — Telephone Encounter (Signed)
The patient came in unexpectedly late yesterday evening to have the labs done. I had only entered CBC, Cmet and Quantiferon Gold Plus.

## 2017-12-04 LAB — QUANTIFERON-TB GOLD PLUS
Mitogen-NIL: >10 IU/mL
NIL: 0.03 [IU]/mL
QuantiFERON-TB Gold Plus: NEGATIVE
TB1-NIL: <0 IU/mL
TB2-NIL: 0 IU/mL

## 2017-12-10 ENCOUNTER — Telehealth: Payer: Self-pay | Admitting: Gastroenterology

## 2017-12-10 NOTE — Telephone Encounter (Signed)
Advised of his lab results and the approval of his Humira.

## 2018-01-14 ENCOUNTER — Other Ambulatory Visit (INDEPENDENT_AMBULATORY_CARE_PROVIDER_SITE_OTHER): Payer: 59

## 2018-01-14 ENCOUNTER — Ambulatory Visit: Payer: 59 | Admitting: Gastroenterology

## 2018-01-14 ENCOUNTER — Encounter: Payer: Self-pay | Admitting: Gastroenterology

## 2018-01-14 VITALS — BP 116/78 | HR 72 | Ht 67.0 in | Wt 170.5 lb

## 2018-01-14 DIAGNOSIS — K5080 Crohn's disease of both small and large intestine without complications: Secondary | ICD-10-CM

## 2018-01-14 DIAGNOSIS — Z79620 Long term (current) use of immunosuppressive biologic: Secondary | ICD-10-CM

## 2018-01-14 DIAGNOSIS — Z79899 Other long term (current) drug therapy: Secondary | ICD-10-CM

## 2018-01-14 DIAGNOSIS — E538 Deficiency of other specified B group vitamins: Secondary | ICD-10-CM

## 2018-01-14 LAB — IBC PANEL
IRON: 64 ug/dL (ref 42–165)
Saturation Ratios: 16.6 % — ABNORMAL LOW (ref 20.0–50.0)
TRANSFERRIN: 275 mg/dL (ref 212.0–360.0)

## 2018-01-14 LAB — VITAMIN B12: VITAMIN B 12: 220 pg/mL (ref 211–911)

## 2018-01-14 LAB — FERRITIN: Ferritin: 80.8 ng/mL (ref 22.0–322.0)

## 2018-01-14 LAB — HIGH SENSITIVITY CRP: CRP, High Sensitivity: 1.9 mg/L (ref 0.000–5.000)

## 2018-01-14 LAB — FOLATE

## 2018-01-14 NOTE — Progress Notes (Signed)
Robert Jenkins    [id number]    08-23-1980  Primary Care Physician:Patient, No Pcp Per  Referring Physician: No referring provider defined for this encounter.  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 Currently on Humira 40 mg every other week with no GI complaints.  Denies any abdominal pain, bloating, nausea, vomiting, constipation, diarrhea or blood in stool.  He is on cholestyramine daily with improvement of diarrhea. Weight is stable and denies any loss of appetite.  Colonoscopy 2013 showed active inflammation at the site of ileocolonic anastomosis otherwise normal exam  Outpatient Encounter Medications as of 01/14/2018  Medication Sig  . Adalimumab (HUMIRA) 40 MG/0.4ML PSKT Inject 40 mg into the skin once a week.  . ALPRAZolam (XANAX) 1 MG tablet TAKE ONE TABLET BY MOUTH AT BEDTIME AS NEEDED FOR ANXIETY FOR HUMIRA TREATMENT  . hyoscyamine (LEVSIN SL) 0.125 MG SL tablet PLACE 1 TABLET (0.125 MG TOTAL) UNDER THE TONGUE EVERY 4 (FOUR) HOURS AS NEEDED.  Marland Kitchen ibuprofen (ADVIL,MOTRIN) 200 MG tablet Take 400 mg by mouth every 6 (six) hours as needed for headache or mild pain.  Marland Kitchen PREVALITE 4 g packet TAKE 1 PACKET (4 G TOTAL) BY MOUTH 2 (TWO) TIMES DAILY. BEFORE BREAKFAST AND DINNER   No facility-administered encounter medications on file as of 01/14/2018.     Allergies as of 01/14/2018  . (No Known Allergies)    Past Medical History:  Diagnosis Date  . Complication of anesthesia    "mental recovery"  . Crohn's disease [facility])     Past Surgical History:  Procedure Laterality Date  . APPENDECTOMY    . COLON SURGERY  2012  . LAPAROTOMY  02/27/2011   Procedure: EXPLORATORY LAPAROTOMY;  Surgeon: Iona Coach, MD;  Location: [facility];  Service: General;  Laterality: N/A;  ileocecotomy  . MOLE  REMOVAL     precancerous    Family History  Problem Relation Age of Onset  . Skin cancer Mother   . Cancer Mother        melanoma    Social History   Socioeconomic History  . Marital status: Married    Spouse name: Not on file  . Number of children: 1  . Years of education: Not on file  . Highest education level: Not on file  Occupational History  . 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 Financial resource strain: Not on file  . Food insecurity:    Worry: Not on file    Inability: Not on file  . Transportation needs:    Medical: Not on file    Non-medical: Not on file  Tobacco Use  . Smoking status: Never Smoker  . Smokeless tobacco: Never Used  Substance and Sexual Activity  . Alcohol use: No  . Drug use: No  . Sexual activity: Never  Lifestyle  . Physical activity:    Days per week: Not on file    Minutes per session: Not on file  . Stress: Not on file  Relationships  . Social connections:    Talks on phone: Not on file    Gets together: Not on file    Attends religious service: Not on file    Active member of club or organization: Not  on file    Attends meetings of clubs or organizations: Not on file    Relationship status: Not on file  . Intimate partner violence:    Fear of current or ex partner: Not on file    Emotionally abused: Not on file    Physically abused: Not on file    Forced sexual activity: Not on file  Other Topics Concern  . Not on file  Social History Narrative  . Not on file      Review of systems: Review of Systems  Constitutional: Negative for fever and chills.  HENT: Positive for runny nose.   Eyes: Negative for blurred vision.  Respiratory: Negative for cough, shortness of breath and wheezing.   Cardiovascular: Negative for chest pain and palpitations.  Gastrointestinal: as per HPI Genitourinary: Negative for dysuria, urgency, frequency and hematuria.  Musculoskeletal:  Negative for myalgias, back pain and joint pain.  Skin: Negative for itching and rash.  Neurological: Negative for dizziness, tremors, focal weakness, seizures and loss of consciousness.  Endo/Heme/Allergies: Positive for seasonal allergies.  Psychiatric/Behavioral: Negative for depression, suicidal ideas and hallucinations.  Positive for anxiety All other systems reviewed and are negative.   Physical Exam: Vitals:   01/14/18 1331  BP: 116/78  Pulse: 72   Body mass index is 26.7 kg/m. Gen:      No acute distress HEENT:  EOMI, sclera anicteric Neck:     No masses; no thyromegaly Lungs:    Clear to auscultation bilaterally; normal respiratory effort CV:         Regular rate and rhythm; no murmurs Abd:      + bowel sounds; soft, non-tender; no palpable masses, no distension Ext:    No edema; adequate peripheral perfusion Skin:      Warm and dry; no rash Neuro: alert and oriented x 3 Psych: normal mood and affect  Data Reviewed:  Reviewed labs, radiology imaging, old records and pertinent past GI work up   Assessment and Plan/Recommendations:  37 year old male with history of Crohn's disease complicated by enteroenteric fistula 2012 status post terminal ileal and right colon resection with ileocolonic anastomosis In clinical remission on Humira 40 mg subcutaneous injection every 2 weeks Reviewed CBC and CMP September 2019 within normal limits.  TB QuantiFERON gold negative We will check CRP to monitor inflammatory marker Also check B12, folate and iron panel Due for surveillance colonoscopy February 2023      K. Scherry Ran , MD [phone]    CC: No ref. provider found

## 2018-01-14 NOTE — Patient Instructions (Signed)
Go to the basement for labs today  Continue Humira every other week  Follow up in 1 year   If you are age 37 or older, your body mass index should be between 23-30. Your Body mass index is 26.7 kg/m. If this is out of the aforementioned range listed, please consider follow up with your Primary Care Provider.  If you are age 39 or younger, your body mass index should be between 19-25. Your Body mass index is 26.7 kg/m. If this is out of the aformentioned range listed, please consider follow up with your Primary Care Provider.    Thank you for choosing [facility]  Karleen Hampshire Nandigam,MD

## 2018-02-07 ENCOUNTER — Other Ambulatory Visit: Payer: Self-pay | Admitting: Gastroenterology

## 2018-02-12 ENCOUNTER — Other Ambulatory Visit: Payer: Self-pay | Admitting: Gastroenterology

## 2018-04-05 ENCOUNTER — Telehealth: Payer: Self-pay | Admitting: Gastroenterology

## 2018-04-05 NOTE — Telephone Encounter (Signed)
[facility] calling regarding instruction for humira as pt stating to be taken it differently than what it is indicated in the order. Pls call the Nursing department for the pharmacy.

## 2018-04-08 ENCOUNTER — Other Ambulatory Visit: Payer: Self-pay

## 2018-04-08 NOTE — Telephone Encounter (Signed)
Humira 40 mg / 0.4 ml every 14 days.  [place] took verbal clarification for the prescription.

## 2018-05-12 ENCOUNTER — Other Ambulatory Visit: Payer: Self-pay | Admitting: Gastroenterology

## 2018-05-14 ENCOUNTER — Ambulatory Visit (INDEPENDENT_AMBULATORY_CARE_PROVIDER_SITE_OTHER): Payer: 59

## 2018-05-14 ENCOUNTER — Encounter: Payer: Self-pay | Admitting: Internal Medicine

## 2018-05-14 ENCOUNTER — Ambulatory Visit: Payer: 59 | Admitting: Internal Medicine

## 2018-05-14 VITALS — BP 112/78 | HR 88 | Temp 98.1°F | Ht 67.0 in | Wt 171.6 lb

## 2018-05-14 DIAGNOSIS — R1012 Left upper quadrant pain: Secondary | ICD-10-CM

## 2018-05-14 DIAGNOSIS — R0602 Shortness of breath: Secondary | ICD-10-CM

## 2018-05-14 DIAGNOSIS — R1013 Epigastric pain: Secondary | ICD-10-CM

## 2018-05-14 DIAGNOSIS — F419 Anxiety disorder, unspecified: Secondary | ICD-10-CM | POA: Diagnosis not present

## 2018-05-14 DIAGNOSIS — Z1329 Encounter for screening for other suspected endocrine disorder: Secondary | ICD-10-CM | POA: Diagnosis not present

## 2018-05-14 DIAGNOSIS — R69 Illness, unspecified: Secondary | ICD-10-CM | POA: Diagnosis not present

## 2018-05-14 DIAGNOSIS — K219 Gastro-esophageal reflux disease without esophagitis: Secondary | ICD-10-CM | POA: Diagnosis not present

## 2018-05-14 DIAGNOSIS — R0789 Other chest pain: Secondary | ICD-10-CM | POA: Diagnosis not present

## 2018-05-14 DIAGNOSIS — G4700 Insomnia, unspecified: Secondary | ICD-10-CM

## 2018-05-14 DIAGNOSIS — E559 Vitamin D deficiency, unspecified: Secondary | ICD-10-CM

## 2018-05-14 DIAGNOSIS — E538 Deficiency of other specified B group vitamins: Secondary | ICD-10-CM | POA: Diagnosis not present

## 2018-05-14 DIAGNOSIS — K50919 Crohn's disease, unspecified, with unspecified complications: Secondary | ICD-10-CM | POA: Diagnosis not present

## 2018-05-14 DIAGNOSIS — Z85820 Personal history of malignant melanoma of skin: Secondary | ICD-10-CM

## 2018-05-14 DIAGNOSIS — Z13220 Encounter for screening for lipoid disorders: Secondary | ICD-10-CM

## 2018-05-14 DIAGNOSIS — K50013 Crohn's disease of small intestine with fistula: Secondary | ICD-10-CM

## 2018-05-14 MED ORDER — PANTOPRAZOLE SODIUM 40 MG PO TBEC: 40.0000 mg | DELAYED_RELEASE_TABLET | Freq: Every day | ORAL | 2 refills | Status: DC

## 2018-05-14 MED ORDER — CLONAZEPAM 0.5 MG PO TABS: 0.2500 mg | ORAL_TABLET | Freq: Every evening | ORAL | 0 refills | Status: DC | PRN

## 2018-05-14 NOTE — Progress Notes (Signed)
Pre visit review using our clinic review tool, if applicable. No additional management support is needed unless otherwise documented below in the visit note. 

## 2018-05-14 NOTE — Patient Instructions (Addendum)
B12 def take 1000 daily    Crohn's Disease  Crohn's disease is a long-lasting (chronic) disease that affects the gastrointestinal (GI) tract. Crohn's disease often causes irritation and inflammation in the small intestine and the beginning of the large intestine, but it can affect any part of the GI tract. Crohn's disease is part of a group of illnesses that are known as inflammatory bowel disease (IBD). Crohn's disease may start slowly and get worse over time. Symptoms may come and go. They may also go away for months or even years at a time (remission). What are the causes? The exact cause of this condition is not known. It may involve a response that causes your body's disease-fighting (immune) system to attack healthy cells and tissues (autoimmune response). Bacteria, genes, and your environment may also play a role. What increases the risk? The following factors may make you more likely to develop this condition:  Having a family member who has Crohn's disease, another IBD, or an autoimmune condition.  Using products that contain nicotine or tobacco, such as cigarettes and e-cigarettes.  Being in your 66s.  Having [place] European ancestry. What are the signs or symptoms? The main symptoms of this condition involve your GI tract. These include:  Diarrhea.  Pain or cramping in the abdomen. This is commonly felt in the lower right side of the abdomen.  Frequent watery or bloody stools.  Constipation. This may mean having: ? Fewer bowel movements in a week than normal. ? Difficulty having a bowel movement. ? Stools that are dry, hard, or larger than normal.  Rectal bleeding.  Rectal pain.  An urgent need to have a bowel movement.  The feeling that you are not finished having a bowel movement. Other symptoms may include:  Unexplained weight loss.  Fatigue.  Fever.  Nausea.  Loss of appetite.  Joint pain.  Vision changes.  Red bumps or sores on the  skin.  Sores inside the mouth. How is this diagnosed? This condition may be diagnosed based on:  Your symptoms and your medical history.  A physical exam.  Tests, which may include: ? Blood tests. ? Stool sample tests. ? Imaging tests, such as X-rays and CT scans. ? Tests to examine the inside of your intestines using a long, flexible tube that has a light and a camera on the end (endoscopy or colonoscopy). ? A procedure to remove tissue samples from inside your bowel for testing (biopsy). You may need to work with a health care provider who specializes in diseases of the digestive tract (gastroenterologist). How is this treated? There is no cure for this condition, but treatment can help you manage your symptoms. Crohn's disease affects each person differently. Your treatment may include:  Resting your bowels. This involves having a period of healing time when your bowels are not passing stools. This may be done by: ? Drinking only clear liquids. These are liquids that you can see through, such as water, black coffee, fruit juice without pulp, broth, gelatin, and ice pops. ? Getting nutrition through an IV for a period of time.  Medicines. These may be used by themselves or with other treatments (combination therapy). These may include antibiotic medicines. You may be given medicines that help to: ? Reduce inflammation. ? Control your immune system activity. ? Fight infections. ? Relieve cramps and prevent diarrhea. ? Control your pain.  Surgery. You may need surgery if: ? Medicines and other treatments are not working anymore. ? You develop complications from  severe Crohn's disease. ? A section of your intestine becomes so damaged that it needs to be removed. Follow these instructions at home: Medicines  Take over-the-counter and prescription medicines only as told by your health care provider.  If you were prescribed an antibiotic, take it as told by your health care  provider. Do not stop taking the antibiotic even if you start to feel better.  Avoid taking ibuprofen or other NSAID medicines if possible, these can make Crohn's disease worse. Eating and drinking  Talk with your health care provider or a diet and nutrition specialist (registered dietitian) about what diet is best for you.  Drink enough fluid to keep your urine pale yellow.  If you are taking steroids to reduce inflammation, get plenty of calcium in your diet to help keep your bones healthy. You may also consider taking a calcium supplement with vitamin D.  Keep a food diary to identify foods that make your symptoms better or worse.  Avoid foods that cause symptoms.  Follow instructions from your health care provider about eating or drinking restrictions if you have worsening symptoms (flare-up).  Limit alcohol intake to no more than 1 drink a day for nonpregnant women and 2 drinks a day for men. One drink equals 12 oz of beer, 5 oz of wine, or 1 oz of hard liquor. General instructions  Make sure you get all the vaccines that your health care provider recommends, especially pneumonia (pneumococcal) and flu (influenza) vaccines.  Do not use any products that contain nicotine or tobacco, such as cigarettes and e-cigarettes. If you need help quitting, ask your health care provider.  Exercise every day, or as often told by your health care provider.  Keep all follow-up visits as told by your health care provider. This is important. Contact a health care provider if:  You have diarrhea, cramps in your abdomen, and other GI problems that are present almost all the time.  Your symptoms do not improve with treatment.  You continue to lose weight.  You develop a rash or sores on your skin.  You develop eye problems.  You have a fever.  Your symptoms get worse.  You develop new symptoms. Get help right away if:  You have bloody diarrhea.  You have severe pain in your  abdomen.  You cannot pass stools. Summary  Crohn's disease affects each person differently. There are multiple treatment options that can help you manage the condition.  Talk with your health care provider or diet and nutrition specialist (registered dietitian) about what diet is best for you.  Make sure you get all the vaccines that your health care provider recommends, especially pneumonia (pneumococcal) and flu (influenza) vaccines. This information is not intended to replace advice given to you by your health care provider. Make sure you discuss any questions you have with your health care provider. Document Released: 12/14/2004 Document Revised: 11/06/2016 Document Reviewed: 11/06/2016 Elsevier Interactive Patient Education  2019 [employer].

## 2018-05-15 ENCOUNTER — Telehealth: Payer: Self-pay | Admitting: Radiology

## 2018-05-15 ENCOUNTER — Other Ambulatory Visit (INDEPENDENT_AMBULATORY_CARE_PROVIDER_SITE_OTHER): Payer: 59

## 2018-05-15 ENCOUNTER — Ambulatory Visit: Payer: 59

## 2018-05-15 ENCOUNTER — Encounter: Payer: Self-pay | Admitting: Internal Medicine

## 2018-05-15 DIAGNOSIS — K50919 Crohn's disease, unspecified, with unspecified complications: Secondary | ICD-10-CM

## 2018-05-15 DIAGNOSIS — F419 Anxiety disorder, unspecified: Secondary | ICD-10-CM | POA: Insufficient documentation

## 2018-05-15 DIAGNOSIS — R1013 Epigastric pain: Secondary | ICD-10-CM

## 2018-05-15 DIAGNOSIS — E559 Vitamin D deficiency, unspecified: Secondary | ICD-10-CM

## 2018-05-15 DIAGNOSIS — E538 Deficiency of other specified B group vitamins: Secondary | ICD-10-CM | POA: Diagnosis not present

## 2018-05-15 DIAGNOSIS — Z1329 Encounter for screening for other suspected endocrine disorder: Secondary | ICD-10-CM

## 2018-05-15 DIAGNOSIS — Z13220 Encounter for screening for lipoid disorders: Secondary | ICD-10-CM

## 2018-05-15 DIAGNOSIS — K219 Gastro-esophageal reflux disease without esophagitis: Secondary | ICD-10-CM

## 2018-05-15 DIAGNOSIS — R1012 Left upper quadrant pain: Secondary | ICD-10-CM | POA: Diagnosis not present

## 2018-05-15 LAB — LIPID PANEL
CHOLESTEROL: 156 mg/dL (ref 0–200)
HDL: 43.6 mg/dL (ref >39.00)
LDL Cholesterol: 77 mg/dL (ref 0–99)
NonHDL: 112.73
Total CHOL/HDL Ratio: 4
Triglycerides: 181 mg/dL — ABNORMAL HIGH (ref 0.0–149.0)
VLDL: 36.2 mg/dL (ref 0.0–40.0)

## 2018-05-15 LAB — COMPREHENSIVE METABOLIC PANEL
ALBUMIN: 4.4 g/dL (ref 3.5–5.2)
ALT: 39 U/L (ref 0–53)
AST: 19 U/L (ref 0–37)
Alkaline Phosphatase: 60 U/L (ref 39–117)
BUN: 15 mg/dL (ref 6–23)
CHLORIDE: 100 meq/L (ref 96–112)
CO2: 30 mEq/L (ref 19–32)
CREATININE: 1.01 mg/dL (ref 0.40–1.50)
Calcium: 9.4 mg/dL (ref 8.4–10.5)
GFR: 82.77 mL/min (ref >60.00)
Glucose, Bld: 152 mg/dL — ABNORMAL HIGH (ref 70–99)
Potassium: 3.8 mEq/L (ref 3.5–5.1)
SODIUM: 138 meq/L (ref 135–145)
Total Bilirubin: 0.7 mg/dL (ref 0.2–1.2)
Total Protein: 7.6 g/dL (ref 6.0–8.3)

## 2018-05-15 LAB — CBC WITH DIFFERENTIAL/PLATELET
Basophils Absolute: 0.1 10*3/uL (ref 0.0–0.1)
Basophils Relative: 1.2 % (ref 0.0–3.0)
Eosinophils Absolute: 0.9 10*3/uL — ABNORMAL HIGH (ref 0.0–0.7)
Eosinophils Relative: 9.2 % — ABNORMAL HIGH (ref 0.0–5.0)
HCT: 44.1 % (ref 39.0–52.0)
Hemoglobin: 15.3 g/dL (ref 13.0–17.0)
Lymphocytes Relative: 25.5 % (ref 12.0–46.0)
Lymphs Abs: 2.6 10*3/uL (ref 0.7–4.0)
MCHC: 34.6 g/dL (ref 30.0–36.0)
MCV: 82.3 fl (ref 78.0–100.0)
Monocytes Absolute: 0.5 10*3/uL (ref 0.1–1.0)
Monocytes Relative: 5.3 % (ref 3.0–12.0)
Neutro Abs: 5.9 10*3/uL (ref 1.4–7.7)
Neutrophils Relative %: 58.8 % (ref 43.0–77.0)
Platelets: 270 10*3/uL (ref 150.0–400.0)
RBC: 5.37 Mil/uL (ref 4.22–5.81)
RDW: 12.9 % (ref 11.5–15.5)
WBC: 10.1 10*3/uL (ref 4.0–10.5)

## 2018-05-15 NOTE — Telephone Encounter (Signed)
Pt was seen yesterday and was placed on lab schedule, please place future orders. Thank you.

## 2018-05-15 NOTE — Progress Notes (Signed)
Chief Complaint  Patient presents with  . Establish Care   New patient former PCP Dr. Myles Rosenthal  1. 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 C/o left flank upper ab pain x 1 month sharp with pain so much intermittently at times affects breathing and hard to get a good breath. He has started new position w/in the last 6 months which is stressful. Denies h/o blood diarrhea. He is on Humira with [facility]  2. H/o sinus infection 1 month ago went to urgent care and also had left neck pain 1 month ago which is intermittent he was not given abx but cough syrup  3. Anxiety uncontrolled on Xanax 0.5 prn qhs he is sleeping 6 hrs interrupted at night  4. H/o MM left abdomen, right ankle, upper back Dr. Nevada Crane dermatology in [place] appt next Tues sees 1x per year. FH mother with MM  Review of Systems  Constitutional: Negative for weight loss.  HENT: Negative for hearing loss.   Eyes: Negative for blurred vision.  Respiratory: Positive for shortness of breath.   Cardiovascular: Negative for chest pain.  Gastrointestinal: Positive for abdominal pain. Negative for blood in stool and diarrhea.  Musculoskeletal: Negative for falls.  Skin: Negative for rash.  Neurological: Negative for headaches.  Psychiatric/Behavioral: The patient is nervous/anxious and has insomnia.    Past Medical History:  Diagnosis Date  . Complication of anesthesia    "mental recovery"  . Crohn's disease [facility])    Past Surgical History:  Procedure Laterality Date  . APPENDECTOMY    . COLON SURGERY  2012  . LAPAROTOMY  02/27/2011   Procedure: EXPLORATORY LAPAROTOMY;  Surgeon: Willey Blade, MD;  Location: [facility];  Service: General;  Laterality: N/A;  ileocecotomy  . MOLE REMOVAL     precancerous   Family History  Problem Relation Age of Onset  . Skin cancer Mother   . Cancer Mother        melanoma   Social History   Socioeconomic History   . Marital status: Married    Spouse name: Not on file  . Number of children: 1  . Years of education: Not on file  . Highest education level: Not on file  Occupational History  . Occupation: Works for the [employer]  . Financial resource strain: Not on file  . Food insecurity:    Worry: Not on file    Inability: Not on file  . Transportation needs:    Medical: Not on file    Non-medical: Not on file  Tobacco Use  . Smoking status: Never Smoker  . Smokeless tobacco: Never Used  Substance and Sexual Activity  . Alcohol use: No  . Drug use: No  . Sexual activity: Never  Lifestyle  . Physical activity:    Days per week: Not on file    Minutes per session: Not on file  . Stress: Not on file  Relationships  . Social connections:    Talks on phone: Not on file    Gets together: Not on file    Attends religious service: Not on file    Active member of club or organization: Not on file    Attends meetings of clubs or organizations: Not on file    Relationship status: Not on file  . Intimate partner violence:  Fear of current or ex partner: Not on file    Emotionally abused: Not on file    Physically abused: Not on file    Forced sexual activity: Not on file  Other Topics Concern  . Not on file  Social History Narrative  . Not on file   Current Meds  Medication Sig  . Adalimumab (HUMIRA) 40 MG/0.4ML PSKT Inject 40 mg into the skin every 14 (fourteen) days.  . [DISCONTINUED] ALPRAZolam (XANAX) 1 MG tablet TAKE 1 TABLET BY MOUTH AT BEDTIME AS NEEDED FOR ANXIETY FOR HUMIRA TREATMENT   No Known Allergies No results found for this or any previous visit (from the past 2160 hour(s)). Objective  Body mass index is 26.88 kg/m. Wt Readings from Last 3 Encounters:  05/14/18 171 lb 9.6 oz (77.8 kg)  01/14/18 170 lb 8 oz (77.3 kg)  02/13/17 168 lb (76.2 kg)   Temp Readings from Last 3 Encounters:  05/14/18 98.1 F (36.7 C)  (Oral)  12/23/16 97.8 F (36.6 C) (Oral)  08/12/15 97.3 F (36.3 C) (Oral)   BP Readings from Last 3 Encounters:  05/14/18 112/78  01/14/18 116/78  02/13/17 100/70   Pulse Readings from Last 3 Encounters:  05/14/18 88  01/14/18 72  02/13/17 72    Physical Exam Vitals signs and nursing note reviewed.  Constitutional:      Appearance: Normal appearance. He is well-developed and well-groomed.  HENT:     Head: Normocephalic and atraumatic.     Nose: Nose normal.     Mouth/Throat:     Mouth: Mucous membranes are moist.     Pharynx: Oropharynx is clear.  Eyes:     Conjunctiva/sclera: Conjunctivae normal.     Pupils: Pupils are equal, round, and reactive to light.  Cardiovascular:     Rate and Rhythm: Normal rate and regular rhythm.     Heart sounds: Normal heart sounds.  Pulmonary:     Effort: Pulmonary effort is normal.     Breath sounds: Normal breath sounds.  Abdominal:     General: Bowel sounds are normal. There is no distension.     Tenderness: There is no abdominal tenderness.  Skin:    General: Skin is warm and dry.  Neurological:     General: No focal deficit present.     Mental Status: He is alert and oriented to person, place, and time. Mental status is at baseline.     Gait: Gait normal.  Psychiatric:        Attention and Perception: Attention and perception normal.        Mood and Affect: Mood and affect normal.        Speech: Speech normal.        Behavior: Behavior normal. Behavior is cooperative.        Thought Content: Thought content normal.        Cognition and Memory: Cognition and memory normal.        Judgment: Judgment normal.     Assessment   1. crohns with left upper quadrant/flank pain Ddx crohns flare vs ulceration r/o kidney stone with h/o kidney stones  2. Anxiety/insomnia uncontrolled  3. H/o MM left abdomen, right ankle, upper back Dr. Nevada Crane dermatology in [place] appt next Tues sees 1x per year. FH mother with MM 4. HM Plan   1.  CXR  today r/o lung etiology  UA r/o kidney stones as h/o kidney stones and can be sx of Crohns  F/u GI [facility] earlier  than summer 2020  Pt declined CT ab/pelvis though I rec to evaluate whether he is having stomach/duodenal sx's vs gastric outlet sx's 2/2 Crohns  Other Ddx ulceration will do trial of protonix 40 qd  Likely needs EGD/repeat colonoscopy with GI  2. Change xanax 0.5 mg qhs to klonopin 0.25-0.5 mg qhs prn Pt does not want to take daily SSRI or other meds daily he is sleeping 6 hrs due to son not sleeping during the night erc increased sleep  3. F/u Dermatology Dr. Nevada Crane in [place] next week  4.  Declines flu shot  Tdap 05/03/14  Check fasting labs upcoming  Declines STD testing married  Dentist Dr. Altha Harm  Former PCP Dr. Hardin Negus  Provider: Dr. Olivia Mackie McLean-Scocuzza-Internal Medicine

## 2018-05-15 NOTE — Addendum Note (Signed)
Addended by: Leeanne Rio on: 05/15/2018 03:08 PM   Modules accepted: Orders

## 2018-05-16 ENCOUNTER — Encounter: Payer: Self-pay | Admitting: Internal Medicine

## 2018-05-16 ENCOUNTER — Telehealth: Payer: Self-pay | Admitting: Internal Medicine

## 2018-05-16 ENCOUNTER — Other Ambulatory Visit: Payer: Self-pay | Admitting: Internal Medicine

## 2018-05-16 ENCOUNTER — Other Ambulatory Visit: Payer: 59

## 2018-05-16 DIAGNOSIS — E559 Vitamin D deficiency, unspecified: Secondary | ICD-10-CM | POA: Insufficient documentation

## 2018-05-16 DIAGNOSIS — R739 Hyperglycemia, unspecified: Secondary | ICD-10-CM

## 2018-05-16 LAB — TSH: TSH: 1.19 u[IU]/mL (ref 0.35–4.50)

## 2018-05-16 LAB — T4, FREE: Free T4: 0.88 ng/dL (ref 0.60–1.60)

## 2018-05-16 LAB — VITAMIN B12: VITAMIN B 12: 224 pg/mL (ref 211–911)

## 2018-05-16 LAB — VITAMIN D 25 HYDROXY (VIT D DEFICIENCY, FRACTURES): VITD: 23.15 ng/mL — ABNORMAL LOW (ref 30.00–100.00)

## 2018-05-16 NOTE — Telephone Encounter (Signed)
Call pt if does not want CT ab/pelvis I rec he see GI for upper endoscopy is he agreeable for referral?

## 2018-05-16 NOTE — Telephone Encounter (Signed)
yes to the GI

## 2018-05-17 ENCOUNTER — Other Ambulatory Visit (INDEPENDENT_AMBULATORY_CARE_PROVIDER_SITE_OTHER): Payer: 59

## 2018-05-17 DIAGNOSIS — R739 Hyperglycemia, unspecified: Secondary | ICD-10-CM

## 2018-05-17 LAB — HEMOGLOBIN A1C: Hgb A1c MFr Bld: 5.3 % (ref 4.6–6.5)

## 2018-05-19 ENCOUNTER — Other Ambulatory Visit: Payer: Self-pay | Admitting: Internal Medicine

## 2018-05-19 DIAGNOSIS — R1012 Left upper quadrant pain: Secondary | ICD-10-CM

## 2018-05-19 DIAGNOSIS — K50919 Crohn's disease, unspecified, with unspecified complications: Secondary | ICD-10-CM

## 2018-05-21 LAB — H PYLORI, IGM, IGG, IGA AB
H pylori, IgM Abs: <9 units (ref 0.0–8.9)
H. pylori, IgA Abs: <9 units (ref 0.0–8.9)
H. pylori, IgG AbS: 0.27 Index Value (ref 0.00–0.79)

## 2018-06-05 ENCOUNTER — Other Ambulatory Visit: Payer: Self-pay

## 2018-06-05 ENCOUNTER — Ambulatory Visit: Payer: 59 | Admitting: Internal Medicine

## 2018-06-05 ENCOUNTER — Encounter: Payer: Self-pay | Admitting: Internal Medicine

## 2018-06-05 VITALS — BP 110/66 | HR 84 | Temp 98.3°F | Ht 67.0 in | Wt 171.4 lb

## 2018-06-05 DIAGNOSIS — J101 Influenza due to other identified influenza virus with other respiratory manifestations: Secondary | ICD-10-CM | POA: Diagnosis not present

## 2018-06-05 DIAGNOSIS — R05 Cough: Secondary | ICD-10-CM

## 2018-06-05 DIAGNOSIS — R059 Cough, unspecified: Secondary | ICD-10-CM

## 2018-06-05 LAB — POC INFLUENZA A&B (BINAX/QUICKVUE)
INFLUENZA B, POC: NEGATIVE
Influenza A, POC: POSITIVE — AB

## 2018-06-05 MED ORDER — ALBUTEROL SULFATE HFA 108 (90 BASE) MCG/ACT IN AERS: 1.0000 | INHALATION_SPRAY | Freq: Four times a day (QID) | RESPIRATORY_TRACT | 0 refills | Status: DC | PRN

## 2018-06-05 MED ORDER — OSELTAMIVIR PHOSPHATE 75 MG PO CAPS: 75.0000 mg | ORAL_CAPSULE | Freq: Two times a day (BID) | ORAL | 0 refills | Status: DC

## 2018-06-05 MED ORDER — AZITHROMYCIN 250 MG PO TABS: ORAL_TABLET | ORAL | 0 refills | Status: DC

## 2018-06-05 MED ORDER — HYDROCOD POLST-CPM POLST ER 10-8 MG/5ML PO SUER: 5.0000 mL | Freq: Every evening | ORAL | 0 refills | Status: DC | PRN

## 2018-06-05 NOTE — Progress Notes (Signed)
Pre visit review using our clinic review tool, if applicable. No additional management support is needed unless otherwise documented below in the visit note. 

## 2018-06-05 NOTE — Patient Instructions (Addendum)
Pick up meds from pharmacy and call back if not better make sure your family gets tested   Influenza, Adult Influenza, more commonly known as "the flu," is a viral infection that mainly affects the respiratory tract. The respiratory tract includes organs that help you breathe, such as the lungs, nose, and throat. The flu causes many symptoms similar to the common cold along with high fever and body aches. The flu spreads easily from person to person (is contagious). Getting a flu shot (influenza vaccination) every year is the best way to prevent the flu. What are the causes? This condition is caused by the influenza virus. You can get the virus by:  Breathing in droplets that are in the air from an infected person's cough or sneeze.  Touching something that has been exposed to the virus (has been contaminated) and then touching your mouth, nose, or eyes. What increases the risk? The following factors may make you more likely to get the flu:  Not washing or sanitizing your hands often.  Having close contact with many people during cold and flu season.  Touching your mouth, eyes, or nose without first washing or sanitizing your hands.  Not getting a yearly (annual) flu shot. You may have a higher risk for the flu, including serious problems such as a lung infection (pneumonia), if you:  Are older than 65.  Are pregnant.  Have a weakened disease-fighting system (immune system). You may have a weakened immune system if you: ? Have HIV or AIDS. ? Are undergoing chemotherapy. ? Are taking medicines that reduce (suppress) the activity of your immune system.  Have a long-term (chronic) illness, such as heart disease, kidney disease, diabetes, or lung disease.  Have a liver disorder.  Are severely overweight (morbidly obese).  Have anemia. This is a condition that affects your red blood cells.  Have asthma. What are the signs or symptoms? Symptoms of this condition usually begin  suddenly and last 4-14 days. They may include:  Fever and chills.  Headaches, body aches, or muscle aches.  Sore throat.  Cough.  Runny or stuffy (congested) nose.  Chest discomfort.  Poor appetite.  Weakness or fatigue.  Dizziness.  Nausea or vomiting. How is this diagnosed? This condition may be diagnosed based on:  Your symptoms and medical history.  A physical exam.  Swabbing your nose or throat and testing the fluid for the influenza virus. How is this treated? If the flu is diagnosed early, you can be treated with medicine that can help reduce how severe the illness is and how long it lasts (antiviral medicine). This may be given by mouth (orally) or through an IV. Taking care of yourself at home can help relieve symptoms. Your health care provider may recommend:  Taking over-the-counter medicines.  Drinking plenty of fluids. In many cases, the flu goes away on its own. If you have severe symptoms or complications, you may be treated in a hospital. Follow these instructions at home: Activity  Rest as needed and get plenty of sleep.  Stay home from work or school as told by your health care provider. Unless you are visiting your health care provider, avoid leaving home until your fever has been gone for 24 hours without taking medicine. Eating and drinking  Take an oral rehydration solution (ORS). This is a drink that is sold at pharmacies and retail stores.  Drink enough fluid to keep your urine pale yellow.  Drink clear fluids in small amounts as you are  able. Clear fluids include water, ice chips, diluted fruit juice, and low-calorie sports drinks.  Eat bland, easy-to-digest foods in small amounts as you are able. These foods include bananas, applesauce, rice, lean meats, toast, and crackers.  Avoid drinking fluids that contain a lot of sugar or caffeine, such as energy drinks, regular sports drinks, and soda.  Avoid alcohol.  Avoid spicy or fatty  foods. General instructions      Take over-the-counter and prescription medicines only as told by your health care provider.  Use a cool mist humidifier to add humidity to the air in your home. This can make it easier to breathe.  Cover your mouth and nose when you cough or sneeze.  Wash your hands with soap and water often, especially after you cough or sneeze. If soap and water are not available, use alcohol-based hand sanitizer.  Keep all follow-up visits as told by your health care provider. This is important. How is this prevented?   Get an annual flu shot. You may get the flu shot in late summer, fall, or winter. Ask your health care provider when you should get your flu shot.  Avoid contact with people who are sick during cold and flu season. This is generally fall and winter. Contact a health care provider if:  You develop new symptoms.  You have: ? Chest pain. ? Diarrhea. ? A fever.  Your cough gets worse.  You produce more mucus.  You feel nauseous or you vomit. Get help right away if:  You develop shortness of breath or difficulty breathing.  Your skin or nails turn a bluish color.  You have severe pain or stiffness in your neck.  You develop a sudden headache or sudden pain in your face or ear.  You cannot eat or drink without vomiting. Summary  Influenza, more commonly known as "the flu," is a viral infection that primarily affects your respiratory tract.  Symptoms of the flu usually begin suddenly and last 4-14 days.  Getting an annual flu shot is the best way to prevent getting the flu.  Stay home from work or school as told by your health care provider. Unless you are visiting your health care provider, avoid leaving home until your fever has been gone for 24 hours without taking medicine.  Keep all follow-up visits as told by your health care provider. This is important. This information is not intended to replace advice given to you by your  health care provider. Make sure you discuss any questions you have with your health care provider. Document Released: 03/03/2000 Document Revised: 08/22/2017 Document Reviewed: 08/22/2017 Elsevier Interactive Patient Education  2019 [employer].

## 2018-06-05 NOTE — Progress Notes (Signed)
Chief Complaint  Patient presents with  . URI   Acute visit/sick visit  1. W/in the last week had sx's worse on monday. No h/o travel. He does work in [place] o fever, no chills, no body aches, no sore throat. He has had runny nose, cough worse at night with phlegm and nausea only. Denies h/a. He tried Robitussin and cough drops. Son has been sick at home x 3 days with fever and went to his pediatrician dx'ed with b/l ear infections but not testing for flu  Review of Systems  Constitutional: Negative for fever.  HENT: Negative for hearing loss.   Eyes: Negative for blurred vision.  Respiratory: Positive for cough and sputum production. Negative for shortness of breath.   Cardiovascular: Negative for chest pain.  Gastrointestinal: Positive for nausea.  Musculoskeletal: Negative for falls.  Skin: Negative for rash.  Neurological: Negative for headaches.   Past Medical History:  Diagnosis Date  . Anxiety   . Complication of anesthesia    "mental recovery"  . Crohn's disease ([place])   . GERD (gastroesophageal reflux disease)   . History of chicken pox   . Kidney stones   . Spider bite    Past Surgical History:  Procedure Laterality Date  . APPENDECTOMY    . COLON SURGERY  2012   fistula 2/2 crohns  . LAPAROTOMY  02/27/2011   Procedure: EXPLORATORY LAPAROTOMY;  Surgeon: Willey Blade, MD;  Location: [facility];  Service: General;  Laterality: N/A;  ileocecotomy  . MOLE REMOVAL     precancerous   Family History  Problem Relation Age of Onset  . Skin cancer Mother   . Cancer Mother        melanoma  . COPD Mother   . Diabetes Mother   . Depression Mother   . Hyperlipidemia Mother   . Hypertension Mother   . Hypertension Father    Social History   Socioeconomic History  . Marital status: Married    Spouse name: Not on file  . Number of children: 1  . Years of education: Not on file  . Highest education level: Not on file  Occupational History  . Occupation: Works  for the [employer]  . Financial resource strain: Not on file  . Food insecurity:    Worry: Not on file    Inability: Not on file  . Transportation needs:    Medical: Not on file    Non-medical: Not on file  Tobacco Use  . Smoking status: Never Smoker  . Smokeless tobacco: Never Used  Substance and Sexual Activity  . Alcohol use: No  . Drug use: No  . Sexual activity: Yes    Comment: wife   Lifestyle  . Physical activity:    Days per week: Not on file    Minutes per session: Not on file  . Stress: Not on file  Relationships  . Social connections:    Talks on phone: Not on file    Gets together: Not on file    Attends religious service: Not on file    Active member of club or organization: Not on file    Attends meetings of clubs or organizations: Not on file    Relationship status: Not on file  . Intimate partner violence:    Fear of current or ex partner: Not on file    Emotionally abused: Not on file    Physically  abused: Not on file    Forced sexual activity: Not on file  Other Topics Concern  . Not on file  Social History Narrative   As of 05/14/18 married with 2 kids 88 y.o girl and 1 y.o boy    HS ed    Utility system supervisor in [place]      No guns, wears seat belt, safe in relationship    Current Meds  Medication Sig  . Adalimumab (HUMIRA) 40 MG/0.4ML PSKT Inject 40 mg into the skin every 14 (fourteen) days.  . clonazePAM (KLONOPIN) 0.5 MG tablet Take 0.5-1 tablets (0.25-0.5 mg total) by mouth at bedtime as needed for anxiety.  . pantoprazole (PROTONIX) 40 MG tablet Take 1 tablet (40 mg total) by mouth daily. Before food 30 minutes   No Known Allergies Recent Results (from the past 2160 hour(s))  H Pylori, IGM, IGG, IGA AB     Status: None   Collection Time: 05/15/18 10:01 AM  Result Value Ref Range   H. pylori, IgG AbS 0.27 0.00 - 0.79 Index Value    Comment:                              Negative            <0.80                              Equivocal    0.80 - 0.89                              Positive           >0.89    H. pylori, IgA Abs <9.0 0.0 - 8.9 units    Comment:                                 Negative          <9.0                                 Equivocal   9.0 - 11.0                                 Positive         >11.0    H pylori, IgM Abs <9.0 0.0 - 8.9 units    Comment:                                 Negative          <9.0                                 Equivocal   9.0 - 11.0                                 Positive         >11.0 This test was developed and its performance characteristics determined by [facility]. It has not been  cleared or approved by the Food and Drug Administration.   Vitamin D (25 hydroxy)     Status: Abnormal   Collection Time: 05/15/18 10:01 AM  Result Value Ref Range   VITD 23.15 (L) 30.00 - 100.00 ng/mL  Vitamin B12     Status: None   Collection Time: 05/15/18 10:01 AM  Result Value Ref Range   Vitamin B-12 224 211 - 911 pg/mL  T4, free     Status: None   Collection Time: 05/15/18 10:01 AM  Result Value Ref Range   Free T4 0.88 0.60 - 1.60 ng/dL    Comment: Specimens from patients who are undergoing biotin therapy and /or ingesting biotin supplements may contain high levels of biotin.  The higher biotin concentration in these specimens interferes with this Free T4 assay.  Specimens that contain high levels  of biotin may cause false high results for this Free T4 assay.  Please interpret results in light of the total clinical presentation of the patient.    TSH     Status: None   Collection Time: 05/15/18 10:01 AM  Result Value Ref Range   TSH 1.19 0.35 - 4.50 uIU/mL  Lipid panel     Status: Abnormal   Collection Time: 05/15/18 10:01 AM  Result Value Ref Range   Cholesterol 156 0 - 200 mg/dL    Comment: ATP III Classification       Desirable:  < 200 mg/dL               Borderline High:  200 - 239 mg/dL          High:  > = 240 mg/dL    Triglycerides 181.0 (H) 0.0 - 149.0 mg/dL    Comment: Normal:  <150 mg/dLBorderline High:  150 - 199 mg/dL   HDL 43.60 >39.00 mg/dL   VLDL 36.2 0.0 - 40.0 mg/dL   LDL Cholesterol 77 0 - 99 mg/dL   Total CHOL/HDL Ratio 4     Comment:                Men          Women1/2 Average Risk     3.4          3.3Average Risk          5.0          4.42X Average Risk          9.6          7.13X Average Risk          15.0          11.0                       NonHDL 112.73     Comment: NOTE:  Non-HDL goal should be 30 mg/dL higher than patient's LDL goal (i.e. LDL goal of < 70 mg/dL, would have non-HDL goal of < 100 mg/dL)  CBC with Differential/Platelet     Status: Abnormal   Collection Time: 05/15/18 10:01 AM  Result Value Ref Range   WBC 10.1 4.0 - 10.5 K/uL   RBC 5.37 4.22 - 5.81 Mil/uL   Hemoglobin 15.3 13.0 - 17.0 g/dL   HCT 44.1 39.0 - 52.0 %   MCV 82.3 78.0 - 100.0 fl   MCHC 34.6 30.0 - 36.0 g/dL   RDW 12.9 11.5 - 15.5 %   Platelets 270.0 150.0 - 400.0 K/uL   Neutrophils Relative % 58.8  43.0 - 77.0 %   Lymphocytes Relative 25.5 12.0 - 46.0 %   Monocytes Relative 5.3 3.0 - 12.0 %   Eosinophils Relative 9.2 (H) 0.0 - 5.0 %   Basophils Relative 1.2 0.0 - 3.0 %   Neutro Abs 5.9 1.4 - 7.7 K/uL   Lymphs Abs 2.6 0.7 - 4.0 K/uL   Monocytes Absolute 0.5 0.1 - 1.0 K/uL   Eosinophils Absolute 0.9 (H) 0.0 - 0.7 K/uL   Basophils Absolute 0.1 0.0 - 0.1 K/uL  Comprehensive metabolic panel     Status: Abnormal   Collection Time: 05/15/18 10:01 AM  Result Value Ref Range   Sodium 138 135 - 145 mEq/L   Potassium 3.8 3.5 - 5.1 mEq/L   Chloride 100 96 - 112 mEq/L   CO2 30 19 - 32 mEq/L   Glucose, Bld 152 (H) 70 - 99 mg/dL   BUN 15 6 - 23 mg/dL   Creatinine, Ser 1.01 0.40 - 1.50 mg/dL   Total Bilirubin 0.7 0.2 - 1.2 mg/dL   Alkaline Phosphatase 60 39 - 117 U/L   AST 19 0 - 37 U/L   ALT 39 0 - 53 U/L   Total Protein 7.6 6.0 - 8.3 g/dL   Albumin 4.4 3.5 - 5.2 g/dL   Calcium 9.4 8.4 - 10.5 mg/dL   GFR  82.77 >60.00 mL/min  Hemoglobin A1c     Status: None   Collection Time: 05/17/18 10:04 AM  Result Value Ref Range   Hgb A1c MFr Bld 5.3 4.6 - 6.5 %    Comment: Glycemic Control Guidelines for People with Diabetes:Non Diabetic:  <6%Goal of Therapy: <7%Additional Action Suggested:  >8%    Objective  Body mass index is 26.85 kg/m. Wt Readings from Last 3 Encounters:  06/05/18 171 lb 6.4 oz (77.7 kg)  05/14/18 171 lb 9.6 oz (77.8 kg)  01/14/18 170 lb 8 oz (77.3 kg)   Temp Readings from Last 3 Encounters:  06/05/18 98.3 F (36.8 C) (Oral)  05/14/18 98.1 F (36.7 C) (Oral)  12/23/16 97.8 F (36.6 C) (Oral)   BP Readings from Last 3 Encounters:  06/05/18 110/66  05/14/18 112/78  01/14/18 116/78   Pulse Readings from Last 3 Encounters:  06/05/18 84  05/14/18 88  01/14/18 72    Physical Exam Vitals signs and nursing note reviewed.  Constitutional:      Appearance: Normal appearance. He is well-developed, well-groomed and normal weight.  HENT:     Head: Normocephalic and atraumatic.     Nose: Nose normal.     Mouth/Throat:     Mouth: Mucous membranes are moist.     Pharynx: Oropharynx is clear.  Eyes:     Conjunctiva/sclera: Conjunctivae normal.     Pupils: Pupils are equal, round, and reactive to light.  Cardiovascular:     Rate and Rhythm: Normal rate and regular rhythm.     Heart sounds: Normal heart sounds. No murmur.  Pulmonary:     Effort: Pulmonary effort is normal.     Breath sounds: Normal breath sounds. No wheezing.  Skin:    General: Skin is warm and dry.  Neurological:     General: No focal deficit present.     Mental Status: He is alert and oriented to person, place, and time. Mental status is at baseline.     Gait: Gait normal.  Psychiatric:        Attention and Perception: Attention and perception normal.  Mood and Affect: Mood and affect normal.        Speech: Speech normal.        Behavior: Behavior normal. Behavior is cooperative.         Thought Content: Thought content normal.        Cognition and Memory: Cognition and memory normal.        Judgment: Judgment normal.     Assessment   1. Influenza A  Plan   1.  Tamiflu, zpack  Supportive care otc cough meds daily and tussionex qhs  Prn Albuterol  Note for  Work to be off until 06/10/18 he was out of work Monday and Weds   Declines flu shot  Tdap 05/03/14  Labs had 05/15/2018   Declines STD testing married  Provider: Dr. Olivia Mackie McLean-Scocuzza-Internal Medicine

## 2018-06-11 ENCOUNTER — Telehealth: Payer: Self-pay | Admitting: Gastroenterology

## 2018-06-11 NOTE — Telephone Encounter (Signed)
Rep from the specialty Pharmacy called to speak with someone about the PA for the medication

## 2018-06-11 NOTE — Telephone Encounter (Signed)
I have spoken to them and I have received the fax for the PA. I spoke with Chrissy.

## 2018-06-14 ENCOUNTER — Ambulatory Visit: Payer: 59 | Admitting: Internal Medicine

## 2018-06-20 ENCOUNTER — Other Ambulatory Visit: Payer: Self-pay | Admitting: Internal Medicine

## 2018-06-20 DIAGNOSIS — F419 Anxiety disorder, unspecified: Secondary | ICD-10-CM

## 2018-06-21 ENCOUNTER — Other Ambulatory Visit: Payer: Self-pay | Admitting: Internal Medicine

## 2018-06-21 DIAGNOSIS — F419 Anxiety disorder, unspecified: Secondary | ICD-10-CM

## 2018-06-21 MED ORDER — CLONAZEPAM 0.5 MG PO TABS: 0.2500 mg | ORAL_TABLET | Freq: Every evening | ORAL | 2 refills | Status: DC | PRN

## 2018-06-21 NOTE — Telephone Encounter (Signed)
Did pt pick up xanax ? He should not be on both inform pt and  Call his pharmacy  If so he may need to wait on klonopin refill?   I felt like he wanted to be on klonopin only   [place]

## 2018-07-08 NOTE — Telephone Encounter (Signed)
Humira Pen 40 mg/0.9m approved  For every 2 weeks on 06/14/2018 until 06/14/2019.

## 2018-08-20 ENCOUNTER — Telehealth: Payer: Self-pay | Admitting: Gastroenterology

## 2018-08-20 NOTE — Telephone Encounter (Signed)
Ok, please schedule virtual visit, next available. Thanks

## 2018-08-20 NOTE — Telephone Encounter (Signed)
Has developed a burning discomfort in the epigastric area. He has taken his Pantoprazole and feels it has helped him. This was prescribed by his PCP provider. He had forgotten he had it. He has not had any changes in his bowel movements, and no nausea or vomiting. Patient admits to working 10+ hour days with no meal breaks.  He will continue the Pantoprazole. Understands to maintain hydration. To take Pantoprazole on an empty stomach. Call us if he does not continue to improve or he acutely worsens.

## 2018-08-26 NOTE — Telephone Encounter (Signed)
Called to schedule the appointment. Patient reports he does not have any further symptoms. In fact doing better.

## 2018-08-27 ENCOUNTER — Other Ambulatory Visit: Payer: Self-pay | Admitting: Gastroenterology

## 2018-09-11 ENCOUNTER — Ambulatory Visit: Payer: 59 | Admitting: Gastroenterology

## 2018-09-25 ENCOUNTER — Other Ambulatory Visit: Payer: Self-pay | Admitting: Internal Medicine

## 2018-09-25 DIAGNOSIS — F419 Anxiety disorder, unspecified: Secondary | ICD-10-CM

## 2018-09-25 MED ORDER — CLONAZEPAM 0.5 MG PO TABS: 0.2500 mg | ORAL_TABLET | Freq: Every evening | ORAL | 2 refills | Status: DC | PRN

## 2018-12-23 ENCOUNTER — Telehealth: Payer: Self-pay | Admitting: Gastroenterology

## 2018-12-23 NOTE — Telephone Encounter (Signed)
On Humira. Due Quantiferon. Last CBC and Cmet is Feb.2020. PCP has addressed vitamin deficiency and elevated glucose.  Okay to do labs before the appointment? The appointment is virtual.

## 2018-12-23 NOTE — Telephone Encounter (Signed)
Pt is scheduled for an appt with Dr. Silverio Decamp on 10/26 and would like order for blood work sent to lab so he can have it done before his appt.

## 2018-12-24 ENCOUNTER — Other Ambulatory Visit: Payer: Self-pay

## 2018-12-24 ENCOUNTER — Other Ambulatory Visit: Payer: Self-pay | Admitting: Internal Medicine

## 2018-12-24 DIAGNOSIS — F419 Anxiety disorder, unspecified: Secondary | ICD-10-CM

## 2018-12-24 DIAGNOSIS — K5080 Crohn's disease of both small and large intestine without complications: Secondary | ICD-10-CM

## 2018-12-24 DIAGNOSIS — E538 Deficiency of other specified B group vitamins: Secondary | ICD-10-CM

## 2018-12-24 DIAGNOSIS — E559 Vitamin D deficiency, unspecified: Secondary | ICD-10-CM

## 2018-12-24 MED ORDER — CLONAZEPAM 0.5 MG PO TABS: 0.2500 mg | ORAL_TABLET | Freq: Every evening | ORAL | 2 refills | Status: DC | PRN

## 2018-12-24 NOTE — Telephone Encounter (Signed)
Please check CBC, CMP, B12, CRP and quantiferon TB. Follow up office visit as scheduled. Thanks

## 2018-12-24 NOTE — Telephone Encounter (Signed)
Patient confirming hours of operation for the lab.

## 2018-12-24 NOTE — Telephone Encounter (Signed)
Pt returned your call.  

## 2018-12-24 NOTE — Telephone Encounter (Signed)
I have entered the orders. Called the patient. No answer. Left a message on his voicemail instructing to come for non-fasting labs.

## 2018-12-25 ENCOUNTER — Other Ambulatory Visit: Payer: Self-pay

## 2018-12-25 ENCOUNTER — Other Ambulatory Visit (INDEPENDENT_AMBULATORY_CARE_PROVIDER_SITE_OTHER): Payer: 59

## 2018-12-25 DIAGNOSIS — E538 Deficiency of other specified B group vitamins: Secondary | ICD-10-CM | POA: Diagnosis not present

## 2018-12-25 DIAGNOSIS — K5080 Crohn's disease of both small and large intestine without complications: Secondary | ICD-10-CM

## 2018-12-25 LAB — COMPREHENSIVE METABOLIC PANEL
ALT: 47 U/L (ref 0–53)
AST: 25 U/L (ref 0–37)
Albumin: 4.4 g/dL (ref 3.5–5.2)
Alkaline Phosphatase: 60 U/L (ref 39–117)
BUN: 12 mg/dL (ref 6–23)
CO2: 26 mEq/L (ref 19–32)
Calcium: 9.4 mg/dL (ref 8.4–10.5)
Chloride: 101 mEq/L (ref 96–112)
Creatinine, Ser: 1.01 mg/dL (ref 0.40–1.50)
GFR: 82.5 mL/min (ref >60.00)
Glucose, Bld: 139 mg/dL — ABNORMAL HIGH (ref 70–99)
Potassium: 3.3 mEq/L — ABNORMAL LOW (ref 3.5–5.1)
Sodium: 139 mEq/L (ref 135–145)
Total Bilirubin: 0.6 mg/dL (ref 0.2–1.2)
Total Protein: 7.6 g/dL (ref 6.0–8.3)

## 2018-12-25 LAB — CBC WITH DIFFERENTIAL/PLATELET
Basophils Absolute: 0.1 10*3/uL (ref 0.0–0.1)
Basophils Relative: 0.7 % (ref 0.0–3.0)
Eosinophils Absolute: 0.7 10*3/uL (ref 0.0–0.7)
Eosinophils Relative: 7.4 % — ABNORMAL HIGH (ref 0.0–5.0)
HCT: 43.8 % (ref 39.0–52.0)
Hemoglobin: 15.1 g/dL (ref 13.0–17.0)
Lymphocytes Relative: 37.9 % (ref 12.0–46.0)
Lymphs Abs: 3.7 10*3/uL (ref 0.7–4.0)
MCHC: 34.4 g/dL (ref 30.0–36.0)
MCV: 82.9 fl (ref 78.0–100.0)
Monocytes Absolute: 0.6 10*3/uL (ref 0.1–1.0)
Monocytes Relative: 6.6 % (ref 3.0–12.0)
Neutro Abs: 4.6 10*3/uL (ref 1.4–7.7)
Neutrophils Relative %: 47.4 % (ref 43.0–77.0)
Platelets: 271 10*3/uL (ref 150.0–400.0)
RBC: 5.28 Mil/uL (ref 4.22–5.81)
RDW: 13.3 % (ref 11.5–15.5)
WBC: 9.7 10*3/uL (ref 4.0–10.5)

## 2018-12-25 LAB — HIGH SENSITIVITY CRP: CRP, High Sensitivity: 1.12 mg/L (ref 0.000–5.000)

## 2018-12-25 LAB — VITAMIN B12: Vitamin B-12: 225 pg/mL (ref 211–911)

## 2018-12-27 ENCOUNTER — Other Ambulatory Visit: Payer: Self-pay

## 2018-12-27 MED ORDER — CYANOCOBALAMIN 1000 MCG/ML IJ SOLN: 1000.0000 ug | INTRAMUSCULAR | 11 refills | Status: DC

## 2018-12-28 LAB — QUANTIFERON-TB GOLD PLUS
Mitogen-NIL: >10 IU/mL
NIL: 0.03 IU/mL
QuantiFERON-TB Gold Plus: NEGATIVE
TB1-NIL: <0 IU/mL
TB2-NIL: <0 IU/mL

## 2019-01-10 ENCOUNTER — Encounter: Payer: Self-pay | Admitting: *Deleted

## 2019-01-13 ENCOUNTER — Ambulatory Visit (INDEPENDENT_AMBULATORY_CARE_PROVIDER_SITE_OTHER): Payer: 59 | Admitting: Gastroenterology

## 2019-01-13 ENCOUNTER — Other Ambulatory Visit: Payer: Self-pay

## 2019-01-13 VITALS — Ht 67.0 in | Wt 170.0 lb

## 2019-01-13 DIAGNOSIS — K9089 Other intestinal malabsorption: Secondary | ICD-10-CM | POA: Diagnosis not present

## 2019-01-13 DIAGNOSIS — E538 Deficiency of other specified B group vitamins: Secondary | ICD-10-CM

## 2019-01-13 DIAGNOSIS — R1012 Left upper quadrant pain: Secondary | ICD-10-CM

## 2019-01-13 DIAGNOSIS — K219 Gastro-esophageal reflux disease without esophagitis: Secondary | ICD-10-CM

## 2019-01-13 DIAGNOSIS — K5010 Crohn's disease of large intestine without complications: Secondary | ICD-10-CM

## 2019-01-13 MED ORDER — "SAFETY SYRINGES/NEEDLE 22G X 1-1/2"" 10 ML MISC": 1 refills | Status: AC

## 2019-01-13 MED ORDER — HUMIRA (2 SYRINGE) 40 MG/0.4ML ~~LOC~~ PSKT: 40.0000 mg | PREFILLED_SYRINGE | SUBCUTANEOUS | 11 refills | Status: DC

## 2019-01-13 MED ORDER — CHOLESTYRAMINE LIGHT 4 G PO PACK: PACK | ORAL | 4 refills | Status: DC

## 2019-01-13 MED ORDER — PANTOPRAZOLE SODIUM 40 MG PO TBEC: 40.0000 mg | DELAYED_RELEASE_TABLET | Freq: Every day | ORAL | 11 refills | Status: DC

## 2019-01-13 NOTE — Progress Notes (Signed)
Robert Jenkins    [id number]    Dec 01, 1980  Primary Care Physician:McLean-Scocuzza, Pasty Spillers, MD  Referring Physician: McLean-Scocuzza, Pasty Spillers, MD [address] [place],  [place] [address]  This service was provided via  telemedicine due to COVID 19 pandemic.  I connected with@ on 01/20/19 at  2:10 PM EDT by a video enabled telemedicine application and verified that I am speaking with the correct person using two identifiers.  Patient location: Home Provider location: Office   I discussed the limitations, risks, security and privacy concerns of performing an evaluation and management service by video enabled telemedicine application and the availability of in person appointments. I also discussed with the patient that there may be a patient responsible charge related to this service. The patient expressed understanding and agreed to proceed.   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  Colonoscopy 2013 showed active inflammation at the site of ileocolonic anastomosis otherwise normal exam  He is on chronic maintenance therapy Humira 40 mg every 2 weeks.  Overall doing well. Denies any nausea, vomiting, abdominal pain, melena or bright red blood per rectum  He is taking cholestyramine daily with 2-3 soft bowel movements daily.  He is having to quit his job as his company is not allowing him to take time off or work remotely.  He is worried about potential exposure to COVID-19 as a number of his coworkers got diagnosed.    Outpatient Encounter Medications as of 01/13/2019  Medication Sig  . Adalimumab (HUMIRA) 40 MG/0.4ML PSKT Inject 40 mg into the skin every 14  (fourteen) days.  Marland Kitchen albuterol (PROVENTIL HFA;VENTOLIN HFA) 108 (90 Base) MCG/ACT inhaler Inhale 1-2 puffs into the lungs every 6 (six) hours as needed for wheezing or shortness of breath.  . chlorpheniramine-HYDROcodone (TUSSIONEX PENNKINETIC ER) 10-8 MG/5ML SUER Take 5 mLs by mouth at bedtime as needed for cough.  . cholestyramine light (PREVALITE) 4 g packet TAKE 1 PACKET (4 G TOTAL) BY MOUTH 2 (TWO) TIMES DAILY. BEFORE BREAKFAST AND DINNER  . clonazePAM (KLONOPIN) 0.5 MG tablet Take 0.5-1 tablets (0.25-0.5 mg total) by mouth at bedtime as needed for anxiety. appt further refills after 3 month supply. appt needed by early 03/2019 further refills  . cyanocobalamin (,VITAMIN B-12,) 1000 MCG/ML injection Inject 1 mL (1,000 mcg total) into the muscle every 30 (thirty) days.  . pantoprazole (PROTONIX) 40 MG tablet Take 1 tablet (40 mg total) by mouth daily. Before food 30 minutes  . SYRINGE-NEEDLE, DISP, 10 ML (SAFETY SYRINGES/NEEDLE) 22G X 1-1/2" 10 ML MISC As directed  . [DISCONTINUED] Adalimumab (HUMIRA) 40 MG/0.4ML PSKT Inject 40 mg into the skin every 14 (fourteen) days.  . [DISCONTINUED] pantoprazole (PROTONIX) 40 MG tablet Take 1 tablet (40 mg total) by mouth daily. Before food 30 minutes  . [DISCONTINUED] PREVALITE 4 g packet TAKE 1 PACKET (4 G TOTAL) BY MOUTH 2 (TWO) TIMES DAILY. BEFORE BREAKFAST AND DINNER   No facility-administered encounter medications on file as of 01/13/2019.     Allergies as of 01/13/2019  . (No Known Allergies)    Past Medical History:  Diagnosis Date  . Anxiety   . Complication of anesthesia    "mental recovery"  .  Crohn's disease (HCC)   . GERD (gastroesophageal reflux disease)   . History of chicken pox   . Kidney stones   . Spider bite     Past Surgical History:  Procedure Laterality Date  . APPENDECTOMY    . COLON SURGERY  2012   fistula 2/2 crohns  . LAPAROTOMY  02/27/2011   Procedure: EXPLORATORY LAPAROTOMY;  Surgeon: Iona Coach, MD;   Location: [facility];  Service: General;  Laterality: N/A;  ileocecotomy  . MOLE REMOVAL     precancerous    Family History  Problem Relation Age of Onset  . Skin cancer Mother   . Cancer Mother        melanoma  . COPD Mother   . Diabetes Mother   . Depression Mother   . Hyperlipidemia Mother   . Hypertension Mother   . Hypertension Father     Social History   Socioeconomic History  . Marital status: Married    Spouse name: Not on file  . Number of children: 1  . Years of education: Not on file  . Highest education level: Not on file  Occupational History  . 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 Financial resource strain: Not on file  . Food insecurity    Worry: Not on file    Inability: Not on file  . Transportation needs    Medical: Not on file    Non-medical: Not on file  Tobacco Use  . Smoking status: Never Smoker  . Smokeless tobacco: Never Used  Substance and Sexual Activity  . Alcohol use: No  . Drug use: No  . Sexual activity: Yes    Comment: wife   Lifestyle  . Physical activity    Days per week: Not on file    Minutes per session: Not on file  . Stress: Not on file  Relationships  . Social Musician on phone: Not on file    Gets together: Not on file    Attends religious service: Not on file    Active member of club or organization: Not on file    Attends meetings of clubs or organizations: Not on file    Relationship status: Not on file  . Intimate partner violence    Fear of current or ex partner: Not on file    Emotionally abused: Not on file    Physically abused: Not on file    Forced sexual activity: Not on file  Other Topics Concern  . Not on file  Social History Narrative   As of 05/14/18 married with 2 kids 4 y.o girl and 1 y.o boy    HS ed    Utility system supervisor in [place]      No guns, wears seat belt, safe in relationship       Review of systems: Review of  Systems as per HPI All other systems reviewed and are negative.   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 The patient was provided an opportunity to ask questions and all were answered. The patient agreed with the plan and demonstrated an understanding of the instructions.   The patient was advised to call back or seek an in-person evaluation if the symptoms worsen or if the condition fails to improve as anticipated.     Marsa Aris, MD   CC: McLean-Scocuzza, French Ana *

## 2019-01-13 NOTE — Patient Instructions (Addendum)
We have sent the following medications to your pharmacy for you to pick up at your convenience: Protonix , Prevalite, and Humira  Rx for syringe and needles for vitamin B-12 have been sent your pharmacy.  Work note has been provided asking that you may work remotely due to chronic medical condition and immunosuppressive therapy.  If you are age 38 or younger, your body mass index should be between 19-25. Your Body mass index is 26.63 kg/m. If this is out of the aformentioned range listed, please consider follow up with your Primary Care Provider.    Follow-up office visit in 1 year  Thank you for choosing me and [facility].  Dr. Silverio Decamp

## 2019-01-14 ENCOUNTER — Other Ambulatory Visit: Payer: Self-pay

## 2019-01-22 ENCOUNTER — Encounter: Payer: Self-pay | Admitting: Gastroenterology

## 2019-03-27 ENCOUNTER — Telehealth: Payer: Self-pay | Admitting: Internal Medicine

## 2019-03-27 ENCOUNTER — Other Ambulatory Visit: Payer: Self-pay | Admitting: Internal Medicine

## 2019-03-27 DIAGNOSIS — F419 Anxiety disorder, unspecified: Secondary | ICD-10-CM

## 2019-03-27 MED ORDER — CLONAZEPAM 0.5 MG PO TABS: 0.2500 mg | ORAL_TABLET | Freq: Every evening | ORAL | 2 refills | Status: DC | PRN

## 2019-03-27 NOTE — Telephone Encounter (Signed)
He will need appt for further refills after this call pt to schedule please   [place]

## 2019-03-27 NOTE — Telephone Encounter (Signed)
Pt is requesting a refill on hisclonazePAM (KLONOPIN) 0.5 MG tablet , pt only has one more pill remaining.

## 2019-04-01 ENCOUNTER — Other Ambulatory Visit: Payer: Self-pay

## 2019-04-01 ENCOUNTER — Ambulatory Visit (INDEPENDENT_AMBULATORY_CARE_PROVIDER_SITE_OTHER): Payer: 59 | Admitting: Internal Medicine

## 2019-04-01 ENCOUNTER — Encounter: Payer: Self-pay | Admitting: Internal Medicine

## 2019-04-01 VITALS — Ht 69.0 in | Wt 165.0 lb

## 2019-04-01 DIAGNOSIS — E538 Deficiency of other specified B group vitamins: Secondary | ICD-10-CM

## 2019-04-01 DIAGNOSIS — E559 Vitamin D deficiency, unspecified: Secondary | ICD-10-CM

## 2019-04-01 DIAGNOSIS — Z1389 Encounter for screening for other disorder: Secondary | ICD-10-CM

## 2019-04-01 DIAGNOSIS — Z1329 Encounter for screening for other suspected endocrine disorder: Secondary | ICD-10-CM

## 2019-04-01 DIAGNOSIS — G4700 Insomnia, unspecified: Secondary | ICD-10-CM

## 2019-04-01 DIAGNOSIS — F419 Anxiety disorder, unspecified: Secondary | ICD-10-CM

## 2019-04-01 DIAGNOSIS — E611 Iron deficiency: Secondary | ICD-10-CM

## 2019-04-01 DIAGNOSIS — K50013 Crohn's disease of small intestine with fistula: Secondary | ICD-10-CM

## 2019-04-01 DIAGNOSIS — Z13220 Encounter for screening for lipoid disorders: Secondary | ICD-10-CM

## 2019-04-01 NOTE — Patient Instructions (Signed)
Hypokalemia Hypokalemia means that the amount of potassium in the blood is lower than normal. Potassium is a chemical (electrolyte) that helps regulate the amount of fluid in the body. It also stimulates muscle tightening (contraction) and helps nerves work properly. Normally, most of the body's potassium is inside cells, and only a very small amount is in the blood. Because the amount in the blood is so small, minor changes to potassium levels in the blood can be life-threatening. What are the causes? This condition may be caused by:  Antibiotic medicine.  Diarrhea or vomiting. Taking too much of a medicine that helps you have a bowel movement (laxative) can cause diarrhea and lead to hypokalemia.  Chronic kidney disease (CKD).  Medicines that help the body get rid of excess fluid (diuretics).  Eating disorders, such as bulimia.  Low magnesium levels in the body.  Sweating a lot. What are the signs or symptoms? Symptoms of this condition include:  Weakness.  Constipation.  Fatigue.  Muscle cramps.  Mental confusion.  Skipped heartbeats or irregular heartbeat (palpitations).  Tingling or numbness. How is this diagnosed? This condition is diagnosed with a blood test. How is this treated? This condition may be treated by:  Taking potassium supplements by mouth.  Adjusting the medicines that you take.  Eating more foods that contain a lot of potassium. If your potassium level is very low, you may need to get potassium through an IV and be monitored in the hospital. Follow these instructions at home:   Take over-the-counter and prescription medicines only as told by your health care provider. This includes vitamins and supplements.  Eat a healthy diet. A healthy diet includes fresh fruits and vegetables, whole grains, healthy fats, and lean proteins.  If instructed, eat more foods that contain a lot of potassium. This includes: ? Nuts, such as peanuts and  pistachios. ? Seeds, such as sunflower seeds and pumpkin seeds. ? Peas, lentils, and lima beans. ? Whole grain and bran cereals and breads. ? Fresh fruits and vegetables, such as apricots, avocado, bananas, cantaloupe, kiwi, oranges, tomatoes, asparagus, and potatoes. ? Orange juice. ? Tomato juice. ? Red meats. ? Yogurt.  Keep all follow-up visits as told by your health care provider. This is important. Contact a health care provider if you:  Have weakness that gets worse.  Feel your heart pounding or racing.  Vomit.  Have diarrhea.  Have diabetes (diabetes mellitus) and you have trouble keeping your blood sugar (glucose) in your target range. Get help right away if you:  Have chest pain.  Have shortness of breath.  Have vomiting or diarrhea that lasts for more than 2 days.  Faint. Summary  Hypokalemia means that the amount of potassium in the blood is lower than normal.  This condition is diagnosed with a blood test.  Hypokalemia may be treated by taking potassium supplements, adjusting the medicines that you take, or eating more foods that are high in potassium.  If your potassium level is very low, you may need to get potassium through an IV and be monitored in the hospital. This information is not intended to replace advice given to you by your health care provider. Make sure you discuss any questions you have with your health care provider. Document Revised: 10/17/2017 Document Reviewed: 10/17/2017 Elsevier Patient Education  [place].

## 2019-04-01 NOTE — Progress Notes (Signed)
Telephone Note  I connected with Ilda Basset on 04/01/19 at  3:45 PM EST by telephone and verified that I am speaking with the correct person using two identifiers.  Location patient: home Location provider:work or home office Persons participating in the virtual visit: patient, provider  I discussed the limitations of evaluation and management by telemedicine and the availability of in person appointments. The patient expressed understanding and agreed to proceed.   HPI: 1. crohns in remission f/u [facility] 12/25/18 low K 3.3 B12 now on B12 shots monthly  2. He thinks he had covid 11/2018 and wife tested + 2x around Thanksgiving  3. 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  Past Medical History:  Diagnosis Date  . Anxiety   . Complication of anesthesia    "mental recovery"  . Crohn's disease ([place])   . GERD (gastroesophageal reflux disease)   . History of chicken pox   . Kidney stones   . Spider bite     Past Surgical History:  Procedure Laterality Date  . APPENDECTOMY    . COLON SURGERY  2012   fistula 2/2 crohns  . LAPAROTOMY  02/27/2011   Procedure: EXPLORATORY LAPAROTOMY;  Surgeon: Willey Blade, MD;  Location: [facility];  Service: General;  Laterality: N/A;  ileocecotomy  . MOLE REMOVAL     precancerous    Family History  Problem Relation Age of Onset  . Skin cancer Mother   . Cancer Mother        melanoma  . COPD Mother   . Diabetes Mother   . Depression Mother   . Hyperlipidemia Mother   . Hypertension Mother   . 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 No guns, wears seat belt, safe in relationship    Current Outpatient Medications:  Marland Kitchen   Multiple Vitamin (MULTIVITAMIN) tablet, Take 1 tablet by mouth daily., Disp: , Rfl:  .  Adalimumab (HUMIRA) 40 MG/0.4ML PSKT, Inject 40 mg into the skin every 14 (fourteen) days., Disp: 2 each, Rfl: 11 .  cholestyramine light (PREVALITE) 4 g packet, TAKE 1 PACKET (4 G TOTAL) BY MOUTH 2 (TWO) TIMES DAILY. BEFORE BREAKFAST AND DINNER, Disp: 180 packet, Rfl: 4 .  clonazePAM (KLONOPIN) 0.5 MG tablet, Take 0.5-1 tablets (0.25-0.5 mg total) by mouth at bedtime as needed for anxiety. appt further refills after 3 month supply no exceptions. AGAIN appt needed call the office to schedule for further refills (Patient taking differently: Take 0.5 mg by mouth at bedtime as needed for anxiety. appt further refills after 3 month supply no exceptions. AGAIN appt needed call the office to schedule for further refills), Disp: 30 tablet, Rfl: 2 .  cyanocobalamin (,VITAMIN B-12,) 1000 MCG/ML injection, Inject 1 mL (1,000 mcg total) into the muscle every 30 (thirty) days., Disp: 1 mL, Rfl: 11 .  pantoprazole (PROTONIX) 40 MG tablet, Take 1 tablet (40 mg total) by mouth daily. Before food 30 minutes, Disp: 30 tablet, Rfl: 11 .  SYRINGE-NEEDLE, DISP, 10 ML (SAFETY SYRINGES/NEEDLE) 22G X 1-1/2" 10 ML MISC, As directed, Disp: 30 each, Rfl: 1  EXAM: before failed audio  VITALS per patient if  applicable:  GENERAL: alert, oriented, appears well and in no acute distress  HEENT: atraumatic, conjunttiva clear, no obvious abnormalities on inspection of external nose and ears  NECK: normal movements of the head and neck  LUNGS: on inspection no signs of respiratory distress, breathing rate appears normal, no obvious gross SOB, gasping or wheezing  CV: no obvious cyanosis  MS: moves all visible extremities without noticeable abnormality  PSYCH/NEURO: pleasant and cooperative, no obvious depression or anxiety, speech and thought processing grossly intact  ASSESSMENT AND PLAN:  Discussed the following assessment and  plan:  Anxiety Insomnia, unspecified type -prn Klonopin   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 Pt prefers to treat via telemedicine empirically rather then risking or undertaking an in person visit at this moment. Patient agrees to seek prompt in person care if worsening, new symptoms arise, or if is not improving with treatment.   I discussed the assessment and treatment plan with the patient. The patient was provided an opportunity to ask questions and all were answered. The patient agreed with the plan and demonstrated an understanding of the instructions.   The patient was advised to call back or seek an in-person evaluation if the symptoms worsen or if the condition fails to improve as anticipated.  Time spent 20 minutes  Delorise Jackson, MD

## 2019-05-07 ENCOUNTER — Telehealth: Payer: Self-pay | Admitting: Gastroenterology

## 2019-05-12 ENCOUNTER — Other Ambulatory Visit: Payer: Self-pay

## 2019-05-12 ENCOUNTER — Telehealth: Payer: Self-pay | Admitting: Gastroenterology

## 2019-05-12 NOTE — Telephone Encounter (Signed)
[facility] faxed information of a "rejection 75" on the Humira prescription. A phone number to [employer] supplied [phone] and a message that a prior authorization is needed.  Called [employer]. I was told to call [facility] [phone].  Called [facility]. Spoke with 2 different people and was told to call [employer]. The pharmacist was able to give me information that I needed from the insurance card and is not available with electronically verified insurance. PA submitted through "Cover My Meds." Medical records included with the form which has been faxed to [employer] at [phone].

## 2019-05-13 NOTE — Telephone Encounter (Signed)
Received response from [employer] with an approval on Humira (CF) Pen 62m/0.4ml from 05/12/19 until 11/09/19.  Of note, they do not utilize Cover My Meds.  Prior authorizations should be sent through [email or web address] submit request.

## 2019-05-14 ENCOUNTER — Other Ambulatory Visit: Payer: Self-pay

## 2019-05-14 MED ORDER — HUMIRA (2 SYRINGE) 40 MG/0.4ML ~~LOC~~ PSKT: 40.0000 mg | PREFILLED_SYRINGE | SUBCUTANEOUS | 11 refills | Status: DC

## 2019-05-21 ENCOUNTER — Other Ambulatory Visit: Payer: Self-pay

## 2019-05-21 MED ORDER — HUMIRA (2 PEN) 40 MG/0.4ML ~~LOC~~ AJKT: 40.0000 mg | AUTO-INJECTOR | SUBCUTANEOUS | 11 refills | Status: DC

## 2019-06-27 ENCOUNTER — Other Ambulatory Visit: Payer: Self-pay | Admitting: Internal Medicine

## 2019-06-27 DIAGNOSIS — F419 Anxiety disorder, unspecified: Secondary | ICD-10-CM

## 2019-06-27 MED ORDER — CLONAZEPAM 0.5 MG PO TABS: 0.2500 mg | ORAL_TABLET | Freq: Every evening | ORAL | 5 refills | Status: DC | PRN

## 2019-07-01 ENCOUNTER — Other Ambulatory Visit: Payer: Self-pay

## 2019-09-30 ENCOUNTER — Ambulatory Visit: Payer: Self-pay | Admitting: Internal Medicine

## 2019-10-20 ENCOUNTER — Telehealth: Payer: Self-pay

## 2019-10-20 NOTE — Telephone Encounter (Signed)
Left the patient a message to call. Confirm his insurance. It is time for me to renew his insurance approval to continue the Humira. He needs to come in to see Dr Silverio Decamp in October. Would like to get that scheduled now. He will need yearly labs as well in October.

## 2019-11-17 ENCOUNTER — Other Ambulatory Visit: Payer: Self-pay

## 2019-11-17 ENCOUNTER — Telehealth: Payer: Self-pay

## 2019-11-17 NOTE — Telephone Encounter (Signed)
Received a faxed request for documentation supporting the continued need for Humira. I had left the patient a message earlier this month asking he call and schedule his follow up appointment. Records faxed as requested.

## 2019-11-17 NOTE — Telephone Encounter (Signed)
Patient is due follow up of his IBD.  He requests a virtual visit. Please advise.

## 2019-11-18 ENCOUNTER — Other Ambulatory Visit: Payer: Self-pay

## 2019-11-18 DIAGNOSIS — K50013 Crohn's disease of small intestine with fistula: Secondary | ICD-10-CM

## 2019-11-18 DIAGNOSIS — Z79620 Long term (current) use of immunosuppressive biologic: Secondary | ICD-10-CM

## 2019-11-18 DIAGNOSIS — E538 Deficiency of other specified B group vitamins: Secondary | ICD-10-CM

## 2019-11-18 NOTE — Telephone Encounter (Signed)
It is fine to check labs before the visit.  Please order CBC, CMP, TB QuantiFERON, vitamin B12, folate, iron panel and CRP (high sensitive).  Thank you

## 2019-11-18 NOTE — Telephone Encounter (Signed)
Can he have his labs before the appointment? I am trying to update his insurance approval for Humira. Thanks

## 2019-11-18 NOTE — Telephone Encounter (Signed)
Ok to schedule virtual visit, next available appointment. He will need to have active my chart with good wifi

## 2019-11-19 ENCOUNTER — Other Ambulatory Visit (INDEPENDENT_AMBULATORY_CARE_PROVIDER_SITE_OTHER): Payer: Self-pay

## 2019-11-19 DIAGNOSIS — Z79620 Long term (current) use of immunosuppressive biologic: Secondary | ICD-10-CM

## 2019-11-19 DIAGNOSIS — K50013 Crohn's disease of small intestine with fistula: Secondary | ICD-10-CM

## 2019-11-19 DIAGNOSIS — E538 Deficiency of other specified B group vitamins: Secondary | ICD-10-CM

## 2019-11-19 LAB — CBC WITH DIFFERENTIAL/PLATELET
Basophils Absolute: 0.1 10*3/uL (ref 0.0–0.1)
Basophils Relative: 0.7 % (ref 0.0–3.0)
Eosinophils Absolute: 0.6 10*3/uL (ref 0.0–0.7)
Eosinophils Relative: 8.1 % — ABNORMAL HIGH (ref 0.0–5.0)
HCT: 45.4 % (ref 39.0–52.0)
Hemoglobin: 15.5 g/dL (ref 13.0–17.0)
Lymphocytes Relative: 38.5 % (ref 12.0–46.0)
Lymphs Abs: 2.8 10*3/uL (ref 0.7–4.0)
MCHC: 34.2 g/dL (ref 30.0–36.0)
MCV: 84.2 fl (ref 78.0–100.0)
Monocytes Absolute: 0.4 10*3/uL (ref 0.1–1.0)
Monocytes Relative: 5.8 % (ref 3.0–12.0)
Neutro Abs: 3.4 10*3/uL (ref 1.4–7.7)
Neutrophils Relative %: 46.9 % (ref 43.0–77.0)
Platelets: 255 10*3/uL (ref 150.0–400.0)
RBC: 5.39 Mil/uL (ref 4.22–5.81)
RDW: 13.5 % (ref 11.5–15.5)
WBC: 7.3 10*3/uL (ref 4.0–10.5)

## 2019-11-19 LAB — COMPREHENSIVE METABOLIC PANEL
ALT: 42 U/L (ref 0–53)
AST: 26 U/L (ref 0–37)
Albumin: 4.5 g/dL (ref 3.5–5.2)
Alkaline Phosphatase: 49 U/L (ref 39–117)
BUN: 15 mg/dL (ref 6–23)
CO2: 27 mEq/L (ref 19–32)
Calcium: 9.3 mg/dL (ref 8.4–10.5)
Chloride: 102 mEq/L (ref 96–112)
Creatinine, Ser: 1.04 mg/dL (ref 0.40–1.50)
GFR: 79.39 mL/min (ref >60.00)
Glucose, Bld: 148 mg/dL — ABNORMAL HIGH (ref 70–99)
Potassium: 3.4 mEq/L — ABNORMAL LOW (ref 3.5–5.1)
Sodium: 138 mEq/L (ref 135–145)
Total Bilirubin: 0.8 mg/dL (ref 0.2–1.2)
Total Protein: 7.5 g/dL (ref 6.0–8.3)

## 2019-11-19 LAB — B12 AND FOLATE PANEL
Folate: >24.8 ng/mL (ref >5.9)
Vitamin B-12: 444 pg/mL (ref 211–911)

## 2019-11-19 LAB — HIGH SENSITIVITY CRP: CRP, High Sensitivity: 0.73 mg/L (ref 0.000–5.000)

## 2019-11-19 LAB — FERRITIN: Ferritin: 85.7 ng/mL (ref 22.0–322.0)

## 2019-11-21 ENCOUNTER — Telehealth: Payer: Self-pay | Admitting: Gastroenterology

## 2019-11-21 LAB — QUANTIFERON-TB GOLD PLUS
Mitogen-NIL: 8.57 IU/mL
NIL: 0.02 IU/mL
QuantiFERON-TB Gold Plus: NEGATIVE
TB1-NIL: 0.01 IU/mL
TB2-NIL: 0.01 IU/mL

## 2019-11-21 MED ORDER — HYOSCYAMINE SULFATE 0.125 MG SL SUBL: 0.1250 mg | SUBLINGUAL_TABLET | SUBLINGUAL | 0 refills | Status: DC | PRN

## 2019-11-21 NOTE — Telephone Encounter (Signed)
Thanks Patty, ok to refill Bentyl.

## 2019-11-21 NOTE — Telephone Encounter (Signed)
Patient called stated he is having a chron's flare up

## 2019-11-21 NOTE — Telephone Encounter (Signed)
The pt has a history of crohns and says he began to have generalized cramping in the abd.that comes and goes.  He does say he worked pretty hard in the heat this week and also ate foods he does not usually eat.  He has some bentyl on hand that Dr Silverio Decamp prescribed and that seems to help.  He would like a refill.  He is going to push fluids and eat a bland diet for a day to and call back if he does not improve.  FYI Dr Silverio Decamp

## 2019-12-01 ENCOUNTER — Telehealth: Payer: Self-pay

## 2019-12-01 NOTE — Telephone Encounter (Signed)
Doc of the Day Patient of Dr Woodward Ku with Crohn's on Humira. Last injection was 11/27/19. He called last week with complaints of stomach cramps. He took Bentyl with improvement for a few days. Friday he started having more intense cramping, a lot of gas, aches in his abdomen, some loose stools.  He is pretty sure he is flaring. He "just doesn't feel good." He began prednisone at 30 mg for 2 days, then 20 mg. He went back to 30 mg today. He can tell it is helping a little, but "it feels like it wears out before the end of the day" "like it's not enough." Very reliable and compliant patient.  Please advise

## 2019-12-01 NOTE — Telephone Encounter (Signed)
Patient having Chron's flare up

## 2019-12-02 ENCOUNTER — Other Ambulatory Visit: Payer: Self-pay

## 2019-12-02 DIAGNOSIS — R197 Diarrhea, unspecified: Secondary | ICD-10-CM

## 2019-12-02 MED ORDER — PREDNISONE 10 MG PO TABS: ORAL_TABLET | ORAL | 0 refills | Status: DC

## 2019-12-02 NOTE — Telephone Encounter (Signed)
Robert Jenkins member advocate with [employer] is requesting a call back from a nurse to discuss the pt's Humira pre authorization.  CB [phone] BTV [phone]

## 2019-12-02 NOTE — Telephone Encounter (Signed)
Returned the call and discussed the issues. The patient was last seen in October of last year. He has his yearly visit in October of this year. Insurance wants recent notes to support response to Humira. There are no other notes to offer. CRP is in normal range. Faxed this as evidence of remission and response to Humira. Patient will be due his next injection 12/11/19. He does not have any more Humira to use. Insurance is refusing to approve the medication.

## 2019-12-02 NOTE — Telephone Encounter (Signed)
Spoke with patient again today. He feels some improvement today on prednisone 30 mg. Less intense abdominal cramping. Bowel movements less frequent but still loose. No blood. Patient instructed to submit a stool specimen for GI pathogen panel and maintain prednisone at 30 mg for 7 days before tapering to 20 mg for 7 days, then 10 mg for 7 days then 5 mg for 7 days per Dr Silverio Decamp, MD. Follow up as scheduled. If he acutely worsens or fails to continue improving, he is to call us asap. Patient's insurance has denied to PA on Humira because he has not been seen recently. I will try to obtain samples.  He is due for Humira 40 mg injection on 12/11/19.

## 2019-12-03 NOTE — Telephone Encounter (Signed)
Humira approved for now based on his labs. I will monitor the schedule for a sooner opening. I have asked him to come in for an in person visit due to these issues with insurance.

## 2019-12-03 NOTE — Telephone Encounter (Signed)
Please see if we can bring him in sooner for office visit with me or APP

## 2019-12-05 LAB — GI PROFILE, STOOL, PCR

## 2019-12-22 ENCOUNTER — Other Ambulatory Visit: Payer: Self-pay | Admitting: Gastroenterology

## 2019-12-24 ENCOUNTER — Other Ambulatory Visit: Payer: Self-pay | Admitting: Internal Medicine

## 2019-12-24 DIAGNOSIS — F419 Anxiety disorder, unspecified: Secondary | ICD-10-CM

## 2019-12-24 MED ORDER — CLONAZEPAM 0.5 MG PO TABS
0.2500 mg | ORAL_TABLET | Freq: Every evening | ORAL | 2 refills | Status: DC | PRN
Start: 2019-12-24 — End: 2019-12-30

## 2019-12-30 ENCOUNTER — Encounter: Payer: Self-pay | Admitting: Gastroenterology

## 2019-12-30 ENCOUNTER — Ambulatory Visit (INDEPENDENT_AMBULATORY_CARE_PROVIDER_SITE_OTHER): Payer: 59 | Admitting: Gastroenterology

## 2019-12-30 VITALS — BP 106/62 | HR 92 | Ht 68.0 in | Wt 155.0 lb

## 2019-12-30 DIAGNOSIS — F419 Anxiety disorder, unspecified: Secondary | ICD-10-CM | POA: Diagnosis not present

## 2019-12-30 DIAGNOSIS — K5010 Crohn's disease of large intestine without complications: Secondary | ICD-10-CM | POA: Diagnosis not present

## 2019-12-30 DIAGNOSIS — R1012 Left upper quadrant pain: Secondary | ICD-10-CM

## 2019-12-30 MED ORDER — PANTOPRAZOLE SODIUM 40 MG PO TBEC: 40.0000 mg | DELAYED_RELEASE_TABLET | Freq: Every day | ORAL | 11 refills | Status: DC

## 2019-12-30 MED ORDER — HYOSCYAMINE SULFATE 0.125 MG SL SUBL: 0.1250 mg | SUBLINGUAL_TABLET | SUBLINGUAL | 6 refills | Status: AC | PRN

## 2019-12-30 MED ORDER — CHOLESTYRAMINE LIGHT 4 G PO PACK
PACK | ORAL | 6 refills | Status: DC
Start: 2019-12-30 — End: 2020-12-07

## 2019-12-30 MED ORDER — CLONAZEPAM 0.5 MG PO TABS: 0.2500 mg | ORAL_TABLET | Freq: Every evening | ORAL | 5 refills | Status: DC | PRN

## 2019-12-30 NOTE — Progress Notes (Signed)
Robert Jenkins    [id number]    11-16-1980  Primary Care Physician:McLean-Scocuzza, Pasty Spillers, MD  Referring Physician: McLean-Scocuzza, Pasty Spillers, MD [address] 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  He had flare of symptoms with diarrhea last month, GI pathogen panel negative for any infectious etiology.  He was treated with prednisone taper with improvement of symptoms. He completed prednisone taper last Sunday.  He was getting constipated associated with abdominal cramping, he ate coleslaw and steak at a cookout back-to-back 2 days.  He held Prevalite  He started taking Prevalite yesterday, on average she has 1-2 bowel movements per day with no blood He is lactose intolerate, notices worsening diarrhea when he drinks milk associated with abdominal cramping which improves with hyoscyamine as needed  No fever, chills, vomiting or rectal bleeding.  Colonoscopy 2013 showed active inflammation at the site of ileocolonic anastomosis otherwise normal exam  CT abdomen pelvis 12/05/2013 showed thickening of the neoterminal ileum with reactive lymphadenopathy ileocolonic mesentery suggestive of acute Crohn's exacerbation  He is on chronic maintenance therapy Humira 40 mg every 2 weeks  Reviewed recent labs September 2021, no significant change   Outpatient Encounter Medications as of 12/30/2019  Medication Sig  . Adalimumab (HUMIRA PEN) 40 MG/0.4ML PNKT Inject 40 mg into the skin every 14 (fourteen) days.  . cholestyramine light (PREVALITE) 4 g packet TAKE 1 PACKET (4 G TOTAL) BY MOUTH 2 (TWO) TIMES DAILY. BEFORE BREAKFAST AND DINNER  . clonazePAM (KLONOPIN) 0.5 MG tablet Take 0.5-1 tablets (0.25-0.5 mg  total) by mouth at bedtime as needed for anxiety. appt further refills after  . cyanocobalamin (,VITAMIN B-12,) 1000 MCG/ML injection INJECT 1 ML (1,000 MCG TOTAL) INTO THE MUSCLE EVERY 30 (THIRTY) DAYS.  . hyoscyamine (LEVSIN SL) 0.125 MG SL tablet Place 1 tablet (0.125 mg total) under the tongue every 4 (four) hours as needed.  . Multiple Vitamin (MULTIVITAMIN) tablet Take 1 tablet by mouth daily.  . pantoprazole (PROTONIX) 40 MG tablet Take 1 tablet (40 mg total) by mouth daily. Before food 30 minutes  . predniSONE (DELTASONE) 10 MG tablet 30 mg x 7 days then 20 mg x 7 days then 10 mg x 7 days then 5 mg x 7 days then as directed  . SYRINGE-NEEDLE, DISP, 10 ML (SAFETY SYRINGES/NEEDLE) 22G X 1-1/2" 10 ML MISC As directed   No facility-administered encounter medications on file as of 12/30/2019.    Allergies as of 12/30/2019  . (No Known Allergies)    Past Medical History:  Diagnosis Date  . Anxiety   . Complication of anesthesia    "mental recovery"  . Crohn's disease (HCC)   . GERD (gastroesophageal reflux disease)   . History of chicken pox   . Kidney stones   . Spider bite     Past Surgical History:  Procedure Laterality Date  . APPENDECTOMY    . COLON SURGERY  2012   fistula 2/2 crohns  . LAPAROTOMY  02/27/2011   Procedure: EXPLORATORY LAPAROTOMY;  Surgeon: Iona Coach, MD;  Location: [facility];  Service: General;  Laterality: N/A;  ileocecotomy  . MOLE REMOVAL     precancerous    Family History  Problem Relation Age of  Onset  . Skin cancer Mother   . Cancer Mother        melanoma  . COPD Mother   . Diabetes Mother   . Depression Mother   . Hyperlipidemia Mother   . Hypertension Mother   . Hypertension Father     Social History   Socioeconomic History  . Marital status: Married    Spouse name: Not on file  . Number of children: 1  . Years of education: Not on file  . Highest education level: Not on file  Occupational History  . Occupation: Works  for the [employer]: [place]  Tobacco Use  . Smoking status: Never Smoker  . Smokeless tobacco: Never Used  Substance and Sexual Activity  . Alcohol use: No  . Drug use: No  . Sexual activity: Yes    Comment: wife   Other Topics Concern  . 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   No guns, wears seat belt, safe in relationship    Social Determinants of Health   Financial Resource Strain:   . Difficulty of Paying Living Expenses: Not on file  Food Insecurity:   . Worried About Programme researcher, broadcasting/film/video in the Last Year: Not on file  . Ran Out of Food in the Last Year: Not on file  Transportation Needs:   . Lack of Transportation (Medical): Not on file  . Lack of Transportation (Non-Medical): Not on file  Physical Activity:   . Days of Exercise per Week: Not on file  . Minutes of Exercise per Session: Not on file  Stress:   . Feeling of Stress : Not on file  Social Connections:   . Frequency of Communication with Friends and Family: Not on file  . Frequency of Social Gatherings with Friends and Family: Not on file  . Attends Religious Services: Not on file  . Active Member of Clubs or Organizations: Not on file  . Attends Banker Meetings: Not on file  . Marital Status: Not on file  Intimate Partner Violence:   . Fear of Current or Ex-Partner: Not on file  . Emotionally Abused: Not on file  . Physically Abused: Not on file  . Sexually Abused: Not on file      Review of systems: All other review of systems negative except as mentioned in the HPI.   Physical Exam: Vitals:   12/30/19 0824  BP: 106/62  Pulse: 92   Body mass index is 23.57 kg/m. 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 and right hemicolectomy Continue Prevalite half to 1 packet daily as needed with goal 1-2 soft bowel movements  Lactose intolerance: use Lactaid pills as needed or avoid milk and dairy products  GERD: Continue Protonix and antireflux measures  Generalized anxiety disorder: We will refill prescription for clonazepam, use as needed  Declined flu and Covid vaccination, he has needle phobia, encouraged patient to reconsider  Return in 6 months or sooner if needed  This visit required >40 minutes of patient care (this includes precharting, chart review, review of results, face-to-face time used for counseling as well as treatment plan and follow-up. The patient was provided an opportunity to ask questions and all were answered. The patient agreed with the plan and demonstrated an understanding of the instructions.  Iona Beard , MD    CC: McLean-Scocuzza, French Ana *

## 2019-12-30 NOTE — Patient Instructions (Signed)
We have refilled your medications today and sent electronically to your pharmacy  Follow up in 6 months  Recall colonoscopy will be in 04/2020, you will receive a reminder letter  If you are age 39 or older, your body mass index should be between 23-30. Your Body mass index is 23.57 kg/m. If this is out of the aforementioned range listed, please consider follow up with your Primary Care Provider.  If you are age 42 or younger, your body mass index should be between 19-25. Your Body mass index is 23.57 kg/m. If this is out of the aformentioned range listed, please consider follow up with your Primary Care Provider.    Due to recent changes in healthcare laws, you may see the results of your imaging and laboratory studies on MyChart before your provider has had a chance to review them.  We understand that in some cases there may be results that are confusing or concerning to you. Not all laboratory results come back in the same time frame and the provider may be waiting for multiple results in order to interpret others.  Please give [place] 48 hours in order for your provider to thoroughly review all the results before contacting the office for clarification of your results.   I appreciate the  opportunity to care for you  Thank You   Harl Bowie , MD

## 2020-01-02 ENCOUNTER — Other Ambulatory Visit: Payer: Self-pay | Admitting: Gastroenterology

## 2020-01-05 NOTE — Telephone Encounter (Signed)
Can Jordell have refills of prednisone?

## 2020-01-08 NOTE — Telephone Encounter (Signed)
He completed the prednisone taper and his symptoms have improved when I saw him in the office recently, please check why patient is requesting additional prednisone or is it in error from pharmacy.  Thank you

## 2020-01-08 NOTE — Telephone Encounter (Signed)
Denied med and put for patient to contact the office

## 2020-06-17 ENCOUNTER — Telehealth: Payer: Self-pay | Admitting: Gastroenterology

## 2020-06-17 NOTE — Telephone Encounter (Signed)
Pt is requesting a call back from a nurse, pt states he has not heard anything back from the medication assistance program.

## 2020-06-17 NOTE — Telephone Encounter (Signed)
The pt states he was told that a patient assistance form will be faxed for Humira assistance to Dr Woodward Ku attention.  I advised we will review when received. The pt has been advised of the information and verbalized understanding.

## 2020-06-23 NOTE — Telephone Encounter (Signed)
Dr Silverio Decamp the form should be on your desk.

## 2020-06-23 NOTE — Telephone Encounter (Signed)
Fyi.

## 2020-06-23 NOTE — Telephone Encounter (Signed)
Patient called in asking about the paperwork for Humira assistance. States he is out of Humira and was wondering if we received the assistance. Would like a call back.

## 2020-06-24 NOTE — Telephone Encounter (Signed)
Vaughan Basta have you seen the form since you filled it out?

## 2020-06-24 NOTE — Telephone Encounter (Signed)
Robert Jenkins said she filled out the form and put it on your desk Dr Silverio Decamp.  Robert Jenkins has already left for the day. I will ask her again tomorrow.

## 2020-06-24 NOTE — Telephone Encounter (Signed)
No I put the blank forms in the nurses box when we received it. I have not seen the forms since.

## 2020-06-24 NOTE — Telephone Encounter (Signed)
Robert Mylar, do you have the paperwork? I do not have it on my desk.

## 2020-06-25 NOTE — Telephone Encounter (Signed)
Dr Shelba Flake found the form on your desk.  She will leave it our for you to sign.

## 2020-06-25 NOTE — Telephone Encounter (Signed)
Ok, thank you. I will try to stop by office and sign it this afternoon

## 2020-07-05 ENCOUNTER — Other Ambulatory Visit: Payer: Self-pay | Admitting: Gastroenterology

## 2020-07-05 DIAGNOSIS — F419 Anxiety disorder, unspecified: Secondary | ICD-10-CM

## 2020-07-06 NOTE — Telephone Encounter (Signed)
Can he have this refill, I though we normally give him xanax

## 2020-07-16 ENCOUNTER — Telehealth: Payer: Self-pay | Admitting: Gastroenterology

## 2020-07-16 NOTE — Telephone Encounter (Signed)
Im waiting on  Response from Dr Silverio Decamp I cant refil this without her authorization

## 2020-07-16 NOTE — Telephone Encounter (Signed)
Inbound call from patient. Need medication refill for Klonopin to [facility]. Have appointment scheduled 07/30/20 with Carl Best.

## 2020-07-19 NOTE — Telephone Encounter (Signed)
Prescription sent to patients pharmacy by Dr. Silverio Decamp on 07/16/20.

## 2020-07-19 NOTE — Telephone Encounter (Signed)
Prescription sent to pharmacy by Dr. Silverio Decamp.

## 2020-07-20 ENCOUNTER — Other Ambulatory Visit: Payer: Self-pay

## 2020-07-20 MED ORDER — HUMIRA (2 PEN) 40 MG/0.4ML ~~LOC~~ AJKT: 40.0000 mg | AUTO-INJECTOR | SUBCUTANEOUS | 0 refills | Status: DC

## 2020-07-20 NOTE — Telephone Encounter (Signed)
Dr Silverio Decamp, Patient is wanting to know if he can cancel his appointment with [facility] for 5/1. He is saying he isnt having any issues and wants to know if he can cancel, I think we scheduled due to med refills    Please advise

## 2020-07-20 NOTE — Telephone Encounter (Signed)
Ok to cancel May appt, please get him in for follow up in Aug/Sep, he will be due for labs and follow up . Thanks

## 2020-07-20 NOTE — Telephone Encounter (Signed)
Called patient and informed him that he could cancel but woul have to be seen in Aug or Sept, Our schedules for those months are not out yet

## 2020-07-30 ENCOUNTER — Ambulatory Visit: Payer: 59 | Admitting: Nurse Practitioner

## 2020-08-11 ENCOUNTER — Other Ambulatory Visit: Payer: Self-pay | Admitting: Gastroenterology

## 2020-08-11 DIAGNOSIS — F419 Anxiety disorder, unspecified: Secondary | ICD-10-CM

## 2020-08-20 ENCOUNTER — Other Ambulatory Visit: Payer: Self-pay | Admitting: Gastroenterology

## 2020-08-20 DIAGNOSIS — F419 Anxiety disorder, unspecified: Secondary | ICD-10-CM

## 2020-08-23 ENCOUNTER — Other Ambulatory Visit: Payer: Self-pay | Admitting: Gastroenterology

## 2020-08-23 DIAGNOSIS — F419 Anxiety disorder, unspecified: Secondary | ICD-10-CM

## 2020-09-03 ENCOUNTER — Telehealth: Payer: Self-pay | Admitting: Gastroenterology

## 2020-09-03 NOTE — Telephone Encounter (Signed)
Dr. Silverio Decamp is not in today and patient needs an appointment.  He can discuss refills for Clonazepam with her at that time.

## 2020-09-03 NOTE — Telephone Encounter (Signed)
Inbound call from patient requesting additional refills for clonazePAM medication be sent to pharmacy in chart please.

## 2020-09-08 ENCOUNTER — Other Ambulatory Visit: Payer: Self-pay | Admitting: Gastroenterology

## 2020-09-08 DIAGNOSIS — F419 Anxiety disorder, unspecified: Secondary | ICD-10-CM

## 2020-09-08 NOTE — Telephone Encounter (Signed)
Patient has [facility] appointment for 12/07/20 requesting refill until then if possible.

## 2020-09-08 NOTE — Telephone Encounter (Signed)
Please advise about refilling patient's Clonazepam refills

## 2020-09-09 NOTE — Telephone Encounter (Signed)
Called pharmacy , The prescription from May they never received so I called in a new rx of clonazepam and pt will have to get further refills from PMD   FYI Dr Silverio Decamp

## 2020-09-10 NOTE — Telephone Encounter (Signed)
Patient never received the refills from 5/27, spoke with pharmacy   Dr Silverio Decamp ok'd the refill back in May. I called in the prescription for the patient on 6/23 but he was informed he needed to start getting his anxiety meds from his PCP.

## 2020-12-07 ENCOUNTER — Other Ambulatory Visit (INDEPENDENT_AMBULATORY_CARE_PROVIDER_SITE_OTHER): Payer: 59

## 2020-12-07 ENCOUNTER — Encounter: Payer: Self-pay | Admitting: Gastroenterology

## 2020-12-07 ENCOUNTER — Ambulatory Visit (INDEPENDENT_AMBULATORY_CARE_PROVIDER_SITE_OTHER): Payer: 59 | Admitting: Gastroenterology

## 2020-12-07 VITALS — BP 110/60 | HR 88 | Ht 67.5 in | Wt 166.5 lb

## 2020-12-07 DIAGNOSIS — K5010 Crohn's disease of large intestine without complications: Secondary | ICD-10-CM

## 2020-12-07 DIAGNOSIS — F419 Anxiety disorder, unspecified: Secondary | ICD-10-CM | POA: Diagnosis not present

## 2020-12-07 LAB — COMPREHENSIVE METABOLIC PANEL
ALT: 62 U/L — ABNORMAL HIGH (ref 0–53)
AST: 27 U/L (ref 0–37)
Albumin: 4.3 g/dL (ref 3.5–5.2)
Alkaline Phosphatase: 60 U/L (ref 39–117)
BUN: 14 mg/dL (ref 6–23)
CO2: 30 mEq/L (ref 19–32)
Calcium: 9.4 mg/dL (ref 8.4–10.5)
Chloride: 101 mEq/L (ref 96–112)
Creatinine, Ser: 1 mg/dL (ref 0.40–1.50)
GFR: 94.26 mL/min (ref >60.00)
Glucose, Bld: 89 mg/dL (ref 70–99)
Potassium: 3.4 mEq/L — ABNORMAL LOW (ref 3.5–5.1)
Sodium: 139 mEq/L (ref 135–145)
Total Bilirubin: 0.6 mg/dL (ref 0.2–1.2)
Total Protein: 7.7 g/dL (ref 6.0–8.3)

## 2020-12-07 LAB — CBC WITH DIFFERENTIAL/PLATELET
Basophils Absolute: 0.1 10*3/uL (ref 0.0–0.1)
Basophils Relative: 1 % (ref 0.0–3.0)
Eosinophils Absolute: 0.9 10*3/uL — ABNORMAL HIGH (ref 0.0–0.7)
Eosinophils Relative: 10.8 % — ABNORMAL HIGH (ref 0.0–5.0)
HCT: 43.6 % (ref 39.0–52.0)
Hemoglobin: 14.7 g/dL (ref 13.0–17.0)
Lymphocytes Relative: 39 % (ref 12.0–46.0)
Lymphs Abs: 3.2 10*3/uL (ref 0.7–4.0)
MCHC: 33.6 g/dL (ref 30.0–36.0)
MCV: 83.5 fl (ref 78.0–100.0)
Monocytes Absolute: 0.7 10*3/uL (ref 0.1–1.0)
Monocytes Relative: 8.7 % (ref 3.0–12.0)
Neutro Abs: 3.3 10*3/uL (ref 1.4–7.7)
Neutrophils Relative %: 40.5 % — ABNORMAL LOW (ref 43.0–77.0)
Platelets: 240 10*3/uL (ref 150.0–400.0)
RBC: 5.22 Mil/uL (ref 4.22–5.81)
RDW: 13.3 % (ref 11.5–15.5)
WBC: 8.2 10*3/uL (ref 4.0–10.5)

## 2020-12-07 LAB — HIGH SENSITIVITY CRP: CRP, High Sensitivity: 1.01 mg/L (ref 0.000–5.000)

## 2020-12-07 LAB — B12 AND FOLATE PANEL
Folate: >23.4 ng/mL (ref >5.9)
Vitamin B-12: 360 pg/mL (ref 211–911)

## 2020-12-07 MED ORDER — CLONAZEPAM 0.5 MG PO TABS: ORAL_TABLET | ORAL | 0 refills | Status: DC

## 2020-12-07 MED ORDER — CYANOCOBALAMIN 1000 MCG/ML IJ SOLN: 1000.0000 ug | INTRAMUSCULAR | 11 refills | Status: DC

## 2020-12-07 MED ORDER — CHOLESTYRAMINE LIGHT 4 G PO PACK: PACK | ORAL | 6 refills | Status: DC

## 2020-12-07 NOTE — Patient Instructions (Signed)
Your provider has requested that you go to the basement level for lab work before leaving today. Press "B" on the elevator. The lab is located at the first door on the left as you exit the elevator.   Your Colon recall is 03/2021  We will refill your medications today  Establish Care with PMD for further clonazepam refills  Follow up in 1 year   If you are age 40 or older, your body mass index should be between 23-30. Your Body mass index is 25.69 kg/m. If this is out of the aforementioned range listed, please consider follow up with your Primary Care Provider.  If you are age 72 or younger, your body mass index should be between 19-25. Your Body mass index is 25.69 kg/m. If this is out of the aformentioned range listed, please consider follow up with your Primary Care Provider.   __________________________________________________________  The [facility] providers would like to encourage you to use [facility] to communicate with providers for non-urgent requests or questions.  Due to long hold times on the telephone, sending your provider a message by [facility] may be a faster and more efficient way to get a response.  Please allow 48 business hours for a response.  Please remember that this is for non-urgent requests.    Due to recent changes in healthcare laws, you may see the results of your imaging and laboratory studies on MyChart before your provider has had a chance to review them.  We understand that in some cases there may be results that are confusing or concerning to you. Not all laboratory results come back in the same time frame and the provider may be waiting for multiple results in order to interpret others.  Please give [place] 48 hours in order for your provider to thoroughly review all the results before contacting the office for clarification of your results.    I appreciate the  opportunity to care for you  Thank You   Harl Bowie , MD

## 2020-12-07 NOTE — Progress Notes (Signed)
Robert Jenkins    [id number]    Jan 27, 1981  Primary Care Physician:McLean-Scocuzza, Pasty Spillers, MD  Referring Physician: McLean-Scocuzza, Pasty Spillers, MD [address] 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   Overall he feels his symptoms are stable On average he has 2-4 bowel movements per day with no blood.  He is using Prevalite once a day He is lactose intolerate, notices worsening diarrhea when he drinks milk associated with abdominal cramping which improves with hyoscyamine as needed   No fever, chills, vomiting or rectal bleeding.   Colonoscopy 2013 showed active inflammation at the site of ileocolonic anastomosis otherwise normal exam   CT abdomen pelvis 12/05/2013 showed thickening of the neoterminal ileum with reactive lymphadenopathy ileocolonic mesentery suggestive of acute Crohn's exacerbation   He is on chronic maintenance therapy Humira 40 mg every 2 weeks     Outpatient Encounter Medications as of 12/07/2020  Medication Sig   Adalimumab (HUMIRA PEN) 40 MG/0.4ML PNKT Inject 40 mg into the skin every 14 (fourteen) days.   cholestyramine light (PREVALITE) 4 g packet TAKE 1 PACKET (4 G TOTAL) BY MOUTH 2 (TWO) TIMES DAILY. BEFORE BREAKFAST AND DINNER   clonazePAM (KLONOPIN) 0.5 MG tablet TAKE 1/2 TO 1 TABLET BY MOUTH AT BEDTIME AS NEEDED FOR ANXIETY   cyanocobalamin (,VITAMIN B-12,) 1000 MCG/ML injection INJECT 1 ML (1,000 MCG TOTAL) INTO THE MUSCLE EVERY 30 (THIRTY) DAYS.   hyoscyamine (LEVSIN SL) 0.125 MG SL tablet Place 1 tablet (0.125 mg total) under the tongue every 4 (four) hours as needed.   Multiple Vitamin (MULTIVITAMIN) tablet Take 1 tablet by mouth daily.   pantoprazole (PROTONIX) 40 MG  tablet Take 1 tablet (40 mg total) by mouth daily. Before food 30 minutes   SYRINGE-NEEDLE, DISP, 10 ML (SAFETY SYRINGES/NEEDLE) 22G X 1-1/2" 10 ML MISC As directed   No facility-administered encounter medications on file as of 12/07/2020.    Allergies as of 12/07/2020   (No Known Allergies)    Past Medical History:  Diagnosis Date   Anxiety    Complication of anesthesia    "mental recovery"   Crohn's disease (HCC)    GERD (gastroesophageal reflux disease)    History of chicken pox    Kidney stones    Spider bite     Past Surgical History:  Procedure Laterality Date   APPENDECTOMY     COLON SURGERY  2012   fistula 2/2 crohns   LAPAROTOMY  02/27/2011   Procedure: EXPLORATORY LAPAROTOMY;  Surgeon: Iona Coach, MD;  Location: [facility];  Service: General;  Laterality: N/A;  ileocecotomy   MOLE REMOVAL     precancerous    Family History  Problem Relation Age of Onset   Skin cancer Mother    Cancer Mother        melanoma   COPD Mother    Diabetes Mother    Depression Mother    Hyperlipidemia Mother    Hypertension Mother    Hypertension Father     Social History   Socioeconomic History   Marital status: Married    Spouse name: Not on file   Number of children: 1   Years of education: Not on file   Highest education  level: Not on file  Occupational History   Occupation: Works for the [employer]: [place]   Tobacco Use   Smoking status: Never   Smokeless tobacco: Never  Vaping Use   Vaping Use: Never used  Substance and Sexual Activity   Alcohol use: No   Drug use: No   Sexual activity: Yes    Comment: wife   Other Topics Concern   Not on file  Social History Narrative   As of 05/14/18 married with 2 kids 4 y.o girl and 1 y.o boy    HS ed    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   No guns, wears seat belt, safe in relationship    Social Determinants of  Health   Financial Resource Strain: Not on file  Food Insecurity: Not on file  Transportation Needs: Not on file  Physical Activity: Not on file  Stress: Not on file  Social Connections: Not on file  Intimate Partner Violence: Not on file      Review of systems: All other review of systems negative except as mentioned in the HPI.   Physical Exam: There were no vitals filed for this visit. Body mass index is 25.69 kg/m. 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 and right hemicolectomy Continue Prevalite half to 1 packet daily as needed with goal 1-2 soft bowel movements   Lactose intolerance: use Lactaid pills as needed or avoid milk and dairy products   GERD: Continue Protonix and antireflux measures   Generalized anxiety disorder: We will refill prescription for clonazepam, use as needed Advised patient to establish with PMD for management of anxiety disorder and future refills of clonazepam   Declined flu and Covid vaccination, he has needle phobia, encouraged patient to reconsider    Return in 1 year or sooner if needed  This visit required 30 minutes of patient care (this includes precharting, chart review, review of results, face-to-face time used for counseling as well as treatment plan and follow-up. The patient was provided an opportunity to ask questions and all were answered. The patient agreed with the plan and demonstrated an understanding of the instructions.  Iona Beard , MD    CC: McLean-Scocuzza, French Ana *

## 2020-12-10 LAB — QUANTIFERON-TB GOLD PLUS
Mitogen-NIL: >10 IU/mL
NIL: 0.05 IU/mL
QuantiFERON-TB Gold Plus: NEGATIVE
TB1-NIL: 0 IU/mL
TB2-NIL: 0 IU/mL

## 2021-04-11 ENCOUNTER — Other Ambulatory Visit: Payer: Self-pay | Admitting: Gastroenterology

## 2021-04-11 DIAGNOSIS — F419 Anxiety disorder, unspecified: Secondary | ICD-10-CM

## 2021-04-12 ENCOUNTER — Other Ambulatory Visit: Payer: Self-pay

## 2021-04-12 DIAGNOSIS — F419 Anxiety disorder, unspecified: Secondary | ICD-10-CM

## 2021-04-12 MED ORDER — CLONAZEPAM 0.5 MG PO TABS: ORAL_TABLET | ORAL | 1 refills | Status: DC

## 2021-04-12 MED ORDER — CLONAZEPAM 0.5 MG PO TABS: 0.5000 mg | ORAL_TABLET | Freq: Two times a day (BID) | ORAL | 1 refills | Status: DC | PRN

## 2021-06-23 ENCOUNTER — Telehealth: Payer: Self-pay

## 2021-06-23 NOTE — Telephone Encounter (Signed)
Doctor's portion of the [place] form faxed. ?

## 2021-06-23 NOTE — Telephone Encounter (Signed)
Patient returned your call .  He uploaded his tax forms to the [place] assist program .  Please contact patient again if you need additional information.  ?

## 2021-06-28 ENCOUNTER — Encounter: Payer: Self-pay | Admitting: Gastroenterology

## 2021-09-07 ENCOUNTER — Other Ambulatory Visit: Payer: Self-pay | Admitting: Gastroenterology

## 2021-09-07 DIAGNOSIS — F419 Anxiety disorder, unspecified: Secondary | ICD-10-CM

## 2021-09-08 NOTE — Telephone Encounter (Signed)
Can he have refills?

## 2022-03-07 ENCOUNTER — Other Ambulatory Visit: Payer: Self-pay | Admitting: Gastroenterology

## 2022-03-15 ENCOUNTER — Encounter: Payer: Self-pay | Admitting: Gastroenterology

## 2022-06-15 ENCOUNTER — Telehealth: Payer: Self-pay | Admitting: *Deleted

## 2022-06-15 NOTE — Telephone Encounter (Signed)
Faxed in refill form to [employer] today [phone] signed by Dr Silverio Decamp

## 2022-10-18 ENCOUNTER — Telehealth: Payer: Self-pay | Admitting: *Deleted

## 2022-10-18 DIAGNOSIS — K5010 Crohn's disease of large intestine without complications: Secondary | ICD-10-CM

## 2022-10-18 NOTE — Telephone Encounter (Signed)
Thank you for all your help with this.

## 2022-10-18 NOTE — Telephone Encounter (Signed)
Spoke with patient and he is coming in Monday for labs, all orders are in what Dr Lavon Paganini wants him to have    Robert Jenkins You still have his [employer]? He is out of Humira

## 2022-10-18 NOTE — Telephone Encounter (Signed)
Beth Nevermind I have it for her to sign Thanks

## 2022-10-19 ENCOUNTER — Other Ambulatory Visit (INDEPENDENT_AMBULATORY_CARE_PROVIDER_SITE_OTHER): Payer: Self-pay

## 2022-10-19 DIAGNOSIS — K5010 Crohn's disease of large intestine without complications: Secondary | ICD-10-CM | POA: Diagnosis not present

## 2022-10-19 LAB — CBC WITH DIFFERENTIAL/PLATELET
Basophils Absolute: 0.1 10*3/uL (ref 0.0–0.1)
Basophils Relative: 0.7 % (ref 0.0–3.0)
Eosinophils Absolute: 1.2 10*3/uL — ABNORMAL HIGH (ref 0.0–0.7)
Eosinophils Relative: 14.5 % — ABNORMAL HIGH (ref 0.0–5.0)
HCT: 47.6 % (ref 39.0–52.0)
Hemoglobin: 15.8 g/dL (ref 13.0–17.0)
Lymphocytes Relative: 36.4 % (ref 12.0–46.0)
Lymphs Abs: 3.1 10*3/uL (ref 0.7–4.0)
MCHC: 33.3 g/dL (ref 30.0–36.0)
MCV: 84.6 fl (ref 78.0–100.0)
Monocytes Absolute: 0.6 10*3/uL (ref 0.1–1.0)
Monocytes Relative: 7 % (ref 3.0–12.0)
Neutro Abs: 3.5 10*3/uL (ref 1.4–7.7)
Neutrophils Relative %: 41.4 % — ABNORMAL LOW (ref 43.0–77.0)
Platelets: 270 10*3/uL (ref 150.0–400.0)
RBC: 5.63 Mil/uL (ref 4.22–5.81)
RDW: 13.8 % (ref 11.5–15.5)
WBC: 8.5 10*3/uL (ref 4.0–10.5)

## 2022-10-19 LAB — VITAMIN D 25 HYDROXY (VIT D DEFICIENCY, FRACTURES): VITD: 36.45 ng/mL (ref 30.00–100.00)

## 2022-10-19 LAB — COMPREHENSIVE METABOLIC PANEL
ALT: 38 U/L (ref 0–53)
AST: 23 U/L (ref 0–37)
Albumin: 4.5 g/dL (ref 3.5–5.2)
Alkaline Phosphatase: 72 U/L (ref 39–117)
BUN: 18 mg/dL (ref 6–23)
CO2: 29 mEq/L (ref 19–32)
Calcium: 9.7 mg/dL (ref 8.4–10.5)
Chloride: 102 mEq/L (ref 96–112)
Creatinine, Ser: 1.14 mg/dL (ref 0.40–1.50)
GFR: 79.5 mL/min (ref >60.00)
Glucose, Bld: 71 mg/dL (ref 70–99)
Potassium: 3.6 mEq/L (ref 3.5–5.1)
Sodium: 139 mEq/L (ref 135–145)
Total Bilirubin: 0.7 mg/dL (ref 0.2–1.2)
Total Protein: 8.2 g/dL (ref 6.0–8.3)

## 2022-10-19 LAB — IBC + FERRITIN
Ferritin: 68.9 ng/mL (ref 22.0–322.0)
Iron: 88 ug/dL (ref 42–165)
Saturation Ratios: 20.9 % (ref 20.0–50.0)
TIBC: 421.4 ug/dL (ref 250.0–450.0)
Transferrin: 301 mg/dL (ref 212.0–360.0)

## 2022-10-19 LAB — B12 AND FOLATE PANEL
Folate: >23.8 ng/mL (ref >5.9)
Vitamin B-12: 369 pg/mL (ref 211–911)

## 2022-10-19 LAB — HIGH SENSITIVITY CRP: CRP, High Sensitivity: 0.72 mg/L (ref 0.000–5.000)

## 2022-10-19 LAB — SEDIMENTATION RATE: Sed Rate: 8 mm/hr (ref 0–15)

## 2022-10-19 NOTE — Telephone Encounter (Signed)
Filled out prescription part of [employer] form and had Dr Lavon Paganini to sign, Faxed back over to the RX Team today   Humira 40 mg every 14 days

## 2022-11-01 NOTE — Telephone Encounter (Signed)
Patient contacted the office with concerns about his Humira. He will run out in 1 week. Beth actually called [employer] and they are processing his request now. That is all we know at this point. Patient is aware I have explained to him that if he has any more questions he should contact them personally at [phone]

## 2022-11-08 ENCOUNTER — Telehealth: Payer: Self-pay | Admitting: Gastroenterology

## 2022-11-08 NOTE — Telephone Encounter (Signed)
Inbound call from patient stating that he has questions about Humira. Requesting a call to discuss. Please advise.

## 2022-11-08 NOTE — Telephone Encounter (Signed)
Patient called [employer] 2 days ago and was told by Denyse Amass the "doctor part" has not been received.  Patient has been without Humira for almost a month. He remains symptoms free at this point.  I will call my field access specialist to get help with the [employer] Patient Assistance. Can we get a sample for him?

## 2022-11-09 NOTE — Telephone Encounter (Signed)
I cannot get samples of Humira now that there are bio-similars available. This is per Verlon Au.

## 2022-11-09 NOTE — Telephone Encounter (Signed)
Well this is kinda out of our hands now, we have done our parts  Agree?

## 2022-11-09 NOTE — Telephone Encounter (Signed)
[employer] rep says application was received 11/06/22 and is being processed. She assures me nothing is lacking that she can tell.

## 2022-11-09 NOTE — Telephone Encounter (Signed)
I have spoken to [place] several times about his application. I sent him a copy of the doctors portion and confirmation fax from 10/18/2022 where it was sent and they received it. He told me he needed samples.

## 2022-11-09 NOTE — Telephone Encounter (Signed)
Please get him samples.  I recall Zella Ball sending in the paperwork many weeks ago for patient assistance.  Not sure what else we need to do from our end

## 2022-11-10 NOTE — Telephone Encounter (Signed)
Samples of this not available

## 2022-11-15 NOTE — Telephone Encounter (Signed)
Patient called to ask if you had samples.

## 2022-11-16 ENCOUNTER — Other Ambulatory Visit: Payer: Self-pay

## 2022-11-16 MED ORDER — HUMIRA (2 PEN) 40 MG/0.4ML ~~LOC~~ AJKT: 40.0000 mg | AUTO-INJECTOR | SUBCUTANEOUS | 6 refills | Status: DC

## 2022-11-16 NOTE — Telephone Encounter (Signed)
Spoke with the patient. Advised of the situation with samples. Patient aware. He will have a $0 co-pay using the coupon card from [place]. Prescription sent to [facility]. It has been a month since he has had an injection. Remains symptom free.

## 2022-11-17 ENCOUNTER — Other Ambulatory Visit: Payer: Self-pay

## 2022-11-17 MED ORDER — HUMIRA (2 PEN) 40 MG/0.4ML ~~LOC~~ AJKT: 40.0000 mg | AUTO-INJECTOR | SUBCUTANEOUS | 6 refills | Status: DC

## 2022-11-17 NOTE — Telephone Encounter (Signed)
Patient stated that he spoke with [facility] and pharmacy has still not received  prescription Patient gave fax number just incase we need it as [phone]. Please advise.

## 2022-11-17 NOTE — Telephone Encounter (Signed)
Rx printed and faxed. Confirmation of successful transmission received.

## 2022-11-23 ENCOUNTER — Telehealth: Payer: Self-pay | Admitting: *Deleted

## 2022-11-23 NOTE — Telephone Encounter (Signed)
Spoke over the phone in detail over this situation with Dorinda Hill

## 2022-11-23 NOTE — Telephone Encounter (Signed)
Inbound call from patient states he needs PA for medication on previous note. Please advise.

## 2022-11-23 NOTE — Telephone Encounter (Signed)
Per [employer] patient has to go through another program called Savings Card Program to try to be approved.    Spoke with Nurse, learning disability at [employer], he was switched to the [employer], He has to use the card with his prescription at his regular pharmacy His copay is $0   I spoke to the patient he has the co-pay card but [facility] is saying he needs a prior authorization which we have not received anything on him at all from [facility].  Im calling to see if I can do a prior auth over the phone  We are waiting on [employer] to decide on the approval. Spoke with Malena Catholic  It is a 24 hour turn around for approval they will call the patient directly once they hear back for [facility].  Patient needs an injection nefore his hunting trip, he leaves next Friday.   Phone number direct to [employer] Copay savings program is [phone]  Patients savings card number from [employer] :  [id number] BIN [id number] GROUP [id number] PCN [id number]

## 2022-11-24 ENCOUNTER — Other Ambulatory Visit: Payer: Self-pay

## 2022-11-24 ENCOUNTER — Telehealth: Payer: Self-pay

## 2022-11-24 DIAGNOSIS — K5010 Crohn's disease of large intestine without complications: Secondary | ICD-10-CM

## 2022-11-24 MED ORDER — PREDNISONE 5 MG PO TABS: ORAL_TABLET | ORAL | 0 refills | Status: DC

## 2022-11-24 NOTE — Telephone Encounter (Signed)
He will need to start with induction dose but dont think he would have the induction dose shipped. Have him do prednisone taper dose and restart the Humira as soon as he gets it

## 2022-11-24 NOTE — Telephone Encounter (Signed)
Patient confirms he does not have diarrhea. He understands to come to the asap if he does develop diarrhea.  Patient checked his calendar and tells me his last Humira injection was 09/22/22.  Does this change anything?

## 2022-11-24 NOTE — Telephone Encounter (Signed)
PA# - [id number]  [facility] - [facility] approved Humira 40 mg/0.4 ml pen from 11/24/22 until 11/24/23.

## 2022-11-24 NOTE — Telephone Encounter (Signed)
Spoke with patient. Humira will be delivered today. The patient has not had an injection in a month. He reports he does not feel as good as he usually does. A slight increase in number of stools. Denies mucous or blood in stools. No abdominal tenderness. "I can just tell something is changing." The patient is going out of town on a "hunt" in a remote area. He asks for a short burst of prednisone to have on hand in case he does flare. Please advise.

## 2022-11-24 NOTE — Telephone Encounter (Signed)
Great  Finally correct insurance information, Patient needs to keep [place] informed of new insurance information, he said it suppose to change again in October ... [employer] gave me complete wrong information yesterday

## 2022-11-24 NOTE — Telephone Encounter (Signed)
Inbound call from PA department in regards to patient humira medication. Sates patient is needing a PA started and will be out of medication soon. Please advise.

## 2022-11-24 NOTE — Telephone Encounter (Signed)
Please advise patient to start taking Humira once it is delivered.  If he has diarrhea, advised him to come to lab to do stool test to exclude C. difficile but if it is slight increase in stool frequency is, continue to monitor.  Okay to send prescription for prednisone 20 mg daily for 3 days and decrease by 5 mg every 3 days until he completes the course.  Thank you

## 2022-11-27 NOTE — Telephone Encounter (Signed)
Patient advised and agrees to this plan of care. Patient reports the first Humira pen misfired. No needle. Advised to contact [employer] asap for replacement.  He agrees to do this.

## 2022-11-30 ENCOUNTER — Other Ambulatory Visit: Payer: Self-pay

## 2022-12-20 ENCOUNTER — Encounter: Payer: Self-pay | Admitting: Gastroenterology

## 2022-12-20 ENCOUNTER — Telehealth: Payer: Self-pay

## 2022-12-20 NOTE — Telephone Encounter (Signed)
Needs new PA for Humira. He has had a change of insurance.  [employer] Subscriber ID- [id number] GROUP NO- [id number] RX [phone]

## 2022-12-28 NOTE — Telephone Encounter (Signed)
Please address this asap so the patient will not have a delay in getting his Humira. He has ongoing therapy.

## 2023-01-01 ENCOUNTER — Telehealth: Payer: Self-pay | Admitting: Pharmacy Technician

## 2023-01-01 ENCOUNTER — Other Ambulatory Visit (HOSPITAL_COMMUNITY): Payer: Self-pay

## 2023-01-01 NOTE — Telephone Encounter (Signed)
Pharmacy Patient Advocate Encounter   Received notification from Pt Calls Messages that prior authorization for HUMIRA 40MG  is required/requested.   Insurance verification completed.   The patient is insured through [facility] .   Per test claim: PA required; PA submitted to [employer] via CoverMyMeds Key/confirmation #/EOC [id number] Status is pending

## 2023-01-01 NOTE — Telephone Encounter (Signed)
Pharmacy Patient Advocate Encounter  Received notification from [facility] that Prior Authorization for HUMIRA 40MG  has been DENIED.  Full denial letter will be uploaded to the media tab. See denial reason below.   PA #/Case ID/Reference #:   Information has been sent to clinical pharmacist for appeals review. It may take 5-7 days to prepare the necessary documentation to request the appeal from the insurance.

## 2023-01-01 NOTE — Telephone Encounter (Signed)
PA has been submitted, and telephone encounter has been created.

## 2023-01-02 NOTE — Telephone Encounter (Signed)
Appeal has been submitted. Will advise when response is received or follow up in 1 week. Please be advised that most companies may take 30 days to make a decision.   Dellie Burns, PharmD Clinical Pharmacist Medication Access Team [facility]  Direct Dial: [phone]

## 2023-01-05 ENCOUNTER — Other Ambulatory Visit: Payer: Self-pay

## 2023-01-05 MED ORDER — HUMIRA (2 PEN) 40 MG/0.4ML ~~LOC~~ AJKT
40.0000 mg | AUTO-INJECTOR | SUBCUTANEOUS | 6 refills | Status: DC
  Filled 2023-01-09 – 2023-01-15 (×3): qty 2, 28d supply, fill #0
  Filled 2023-02-08: qty 2, 28d supply, fill #1
  Filled 2023-03-09: qty 2, 28d supply, fill #2
  Filled 2023-04-09: qty 2, 28d supply, fill #3
  Filled 2023-05-09: qty 2, 28d supply, fill #4
  Filled 2023-06-14 (×2): qty 2, 28d supply, fill #5
  Filled 2023-07-11: qty 2, 28d supply, fill #6

## 2023-01-08 ENCOUNTER — Other Ambulatory Visit (HOSPITAL_COMMUNITY): Payer: Self-pay

## 2023-01-08 ENCOUNTER — Other Ambulatory Visit: Payer: Self-pay

## 2023-01-08 NOTE — Telephone Encounter (Signed)
Pharmacy Patient Advocate Encounter  Received notification from [facility] that Prior Authorization for HUMIRA 40MG  has been APPROVED from 10.14.24 to 10.14.25   PA #/Case ID/Reference #: [id number]

## 2023-01-09 ENCOUNTER — Other Ambulatory Visit: Payer: Self-pay

## 2023-01-09 ENCOUNTER — Other Ambulatory Visit (HOSPITAL_COMMUNITY): Payer: Self-pay

## 2023-01-09 NOTE — Progress Notes (Signed)
Specialty Pharmacy Initiation Note   Robert Jenkins. is a 42 y.o. male who will be followed by the specialty pharmacy service for RxSp Crohn's Disease    Review of administration, indication, effectiveness, safety, potential side effects, and storage/disposable occurred today for patient's specialty medication(s) Adalimumab     Patient/Caregiver did not have any additional questions or concerns.   Patient has been on Humira previously.   Patient's therapy is appropriate to: Initiate    Goals Addressed             This Visit's Progress    Minimize recurrence of flares       Patient is initiating therapy. Patient will maintain adherence.         [name] Specialty Pharmacist

## 2023-01-09 NOTE — Progress Notes (Signed)
Specialty Pharmacy Initial Fill Coordination Note  Robert Gring. is a 42 y.o. male contacted today regarding refills of specialty medication(s) Adalimumab   Patient requested Delivery   Delivery date: 01/16/23   Verified address: [address]   Medication will be filled on 01/15/23.   Patient is aware of $0.00 copayment.

## 2023-01-15 ENCOUNTER — Other Ambulatory Visit (HOSPITAL_COMMUNITY): Payer: Self-pay

## 2023-01-15 ENCOUNTER — Other Ambulatory Visit: Payer: Self-pay

## 2023-01-15 ENCOUNTER — Other Ambulatory Visit: Payer: Self-pay | Admitting: Gastroenterology

## 2023-01-17 ENCOUNTER — Other Ambulatory Visit: Payer: Self-pay

## 2023-01-19 ENCOUNTER — Other Ambulatory Visit: Payer: Self-pay

## 2023-01-22 ENCOUNTER — Other Ambulatory Visit: Payer: Self-pay

## 2023-02-02 ENCOUNTER — Ambulatory Visit (INDEPENDENT_AMBULATORY_CARE_PROVIDER_SITE_OTHER): Payer: BC Managed Care – PPO | Admitting: Gastroenterology

## 2023-02-02 ENCOUNTER — Encounter: Payer: Self-pay | Admitting: Gastroenterology

## 2023-02-02 VITALS — BP 110/64 | HR 80 | Ht 67.5 in | Wt 168.2 lb

## 2023-02-02 DIAGNOSIS — K5080 Crohn's disease of both small and large intestine without complications: Secondary | ICD-10-CM

## 2023-02-02 DIAGNOSIS — E538 Deficiency of other specified B group vitamins: Secondary | ICD-10-CM | POA: Diagnosis not present

## 2023-02-02 MED ORDER — NA SULFATE-K SULFATE-MG SULF 17.5-3.13-1.6 GM/177ML PO SOLN: 1.0000 | Freq: Once | ORAL | 0 refills | Status: AC

## 2023-02-02 NOTE — Patient Instructions (Addendum)
VISIT SUMMARY:  You came in today for a routine follow-up for your Crohn's disease after a two-year gap. Overall, your Crohn's disease appears well-controlled with no diarrhea or blood in your stool. However, you have not been taking your B12 shots regularly, which may be contributing to your fatigue. We also discussed issues with your Humira medication due to insurance complications, your frequent outdoor activities and tick bites, and your interest in checking your testosterone levels. You also agreed to schedule a colonoscopy, as your last one was twelve years ago.  YOUR PLAN:  -CROHN'S DISEASE: Crohn's disease is a chronic inflammatory bowel disease that affects the lining of the digestive tract. Your condition appears stable with no reported diarrhea or blood in your stool. Please continue taking Humira as prescribed.  -VITAMIN B12 DEFICIENCY: Vitamin B12 deficiency occurs when your body doesn't have enough of this essential vitamin, which can lead to fatigue and other symptoms. Since oral supplements have not been effective, you should increase the frequency of your B12 injections to every two weeks for a month, then switch to monthly injections.  -COLONOSCOPY: A colonoscopy is a procedure used to examine the inside of your colon and rectum. Given your history of Crohn's disease, it is recommended to have this procedure every 1-3 years. Your colonoscopy is scheduled for December 13th at 9:00 AM.   -GENERAL HEALTH MAINTENANCE: It is important to have regular check-ups to monitor your overall health. Please establish care with a primary care provider for regular check-ups. We will also check your B12 levels at your next lab draw. Follow-up in 6 months or sooner if any issues arise.  INSTRUCTIONS:  Please follow up in 6 months or sooner if any issues arise. Your colonoscopy is scheduled for December 13th at 9:00 AM. Make sure to establish care with a primary care provider for regular  check-ups.  Due to recent changes in healthcare laws, you may see the results of your imaging and laboratory studies on MyChart before your provider has had a chance to review them.  We understand that in some cases there may be results that are confusing or concerning to you. Not all laboratory results come back in the same time frame and the provider may be waiting for multiple results in order to interpret others.  Please give [place] 48 hours in order for your provider to thoroughly review all the results before contacting the office for clarification of your results.    You have been scheduled for a colonoscopy. Please follow written instructions given to you at your visit today.   Please pick up your prep supplies at the pharmacy within the next 1-3 days.  If you use inhalers (even only as needed), please bring them with you on the day of your procedure.  DO NOT TAKE 7 DAYS PRIOR TO TEST- Trulicity (dulaglutide) Ozempic, Wegovy (semaglutide) Mounjaro (tirzepatide) Bydureon Bcise (exanatide extended release)  DO NOT TAKE 1 DAY PRIOR TO YOUR TEST Rybelsus (semaglutide) Adlyxin (lixisenatide) Victoza (liraglutide) Byetta (exanatide) ___________________________________________________________________________   I appreciate the  opportunity to care for you  Thank You   Marsa Aris , MD

## 2023-02-02 NOTE — Progress Notes (Signed)
Robert Jenkins    [id number]    Aug 13, 1980  Primary Care Physician:Patient, No Pcp Per  Referring Physician: No referring provider defined for this encounter.   Chief complaint:  Crohn's disease  Discussed the use of AI scribe software for clinical note transcription with the patient, who gave verbal consent to proceed.  History of Present Illness   The patient, with a history of Crohn's disease, presents for a routine follow-up after a two-year gap. He reports overall good health with no diarrhea or blood in the stool, indicating well-controlled Crohn's disease. However, he has not been administering his B12 shots, last taken three to four months ago, and has been relying on oral supplements instead. This has resulted in lower B12 levels, which the patient believes may be contributing to his feelings of fatigue.  The patient also reports issues with his medication, Humira, due to insurance complications. This has caused some disruption in his treatment plan. 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  The patient also mentions frequent outdoor activities, during which he has experienced multiple tick bites. He reports no symptoms of tick-borne illnesses, such as fever or joint pain, but expresses concern about the potential risks.  Lastly, the patient expresses interest in having his testosterone levels checked during his next lab work, suspecting it may be contributing to his feelings of fatigue. He also consents to a colonoscopy, his last one being twelve years ago, and requests an early morning appointment for the procedure.      Colonoscopy 2013 showed active inflammation at the site of ileocolonic anastomosis otherwise normal exam   CT abdomen pelvis 12/05/2013 showed thickening of the neoterminal ileum with reactive lymphadenopathy ileocolonic mesentery suggestive of acute Crohn's exacerbation    He is on chronic maintenance therapy Humira 40 mg every 2 weeks   Outpatient Encounter Medications as of 02/02/2023  Medication Sig   adalimumab (HUMIRA, 2 PEN,) 40 MG/0.4ML pen Inject 0.4 mLs (40 mg total) into the skin every 14 (fourteen) days.   cholestyramine light (PREVALITE) 4 g packet TAKE 1 PACKET (4 G TOTAL) BY MOUTH 2 (TWO) TIMES DAILY. BEFORE BREAKFAST AND DINNER   clonazePAM (KLONOPIN) 0.5 MG tablet TAKE 1/2 TO 1 TABLET BY MOUTH AT BEDTIME AS NEEDED FOR ANXIETY   hyoscyamine (LEVSIN SL) 0.125 MG SL tablet Place 1 tablet (0.125 mg total) under the tongue every 4 (four) hours as needed.   Multiple Vitamin (MULTIVITAMIN) tablet Take 1 tablet by mouth daily.   [EXPIRED] Na Sulfate-K Sulfate-Mg Sulf 17.5-3.13-1.6 GM/177ML SOLN Take 1 kit by mouth once for 1 dose.   SYRINGE-NEEDLE, DISP, 10 ML (SAFETY SYRINGES/NEEDLE) 22G X 1-1/2" 10 ML MISC As directed   [DISCONTINUED] cyanocobalamin (,VITAMIN B-12,) 1000 MCG/ML injection Inject 1 mL (1,000 mcg total) into the muscle every 30 (thirty) days.   [DISCONTINUED] Melatonin 10 MG TABS Take 1 tablet by mouth at bedtime.   [DISCONTINUED] predniSONE (DELTASONE) 5 MG tablet Take 20 mg PO daily x 3 days and decrease by 5 mg every 3 days   No facility-administered encounter medications on file as of 02/02/2023.    Allergies as of 02/02/2023   (No Known Allergies)    Past Medical History:  Diagnosis Date   Anxiety    Complication of anesthesia    "mental recovery"   Crohn's disease (HCC)    GERD (gastroesophageal reflux disease)  History of chicken pox    Kidney stones    Spider bite     Past Surgical History:  Procedure Laterality Date   APPENDECTOMY     COLON SURGERY  2012   fistula 2/2 crohns   LAPAROTOMY  02/27/2011   Procedure: EXPLORATORY LAPAROTOMY;  Surgeon: Iona Coach, MD;  Location: [facility];  Service: General;  Laterality: N/A;  ileocecotomy   MOLE REMOVAL     precancerous    Family History  Problem  Relation Age of Onset   Skin cancer Mother    Cancer Mother        melanoma   COPD Mother    Diabetes Mother    Depression Mother    Hyperlipidemia Mother    Hypertension Mother    Hypertension Father     Social History   Socioeconomic History   Marital status: Married    Spouse name: Not on file   Number of children: 1   Years of education: Not on file   Highest education level: Not on file  Occupational History   Occupation: Works for the [employer]    Employer: [employer]  Tobacco Use   Smoking status: Never   Smokeless tobacco: Never  Vaping Use   Vaping status: Never Used  Substance and Sexual Activity   Alcohol use: No   Drug use: No   Sexual activity: Yes    Comment: wife   Other Topics Concern   Not on file  Social History Narrative   As of 05/14/18 married with 2 kids 4 y.o girl and 1 y.o boy    HS ed    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   No guns, wears seat belt, safe in relationship    Social Determinants of Health   Financial Resource Strain: Not on file  Food Insecurity: Not on file  Transportation Needs: Not on file  Physical Activity: Not on file  Stress: Not on file  Social Connections: Not on file  Intimate Partner Violence: Not on file      Review of systems: All other review of systems negative except as mentioned in the HPI.   Physical Exam: Vitals:   02/02/23 0956  BP: 110/64  Pulse: 80  SpO2: 99%   Body mass index is 25.96 kg/m. 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 Patient is on Humira. -Continue Humira as prescribed.  Vitamin B12 Deficiency Patient reports not taking B12 injections for the past 3-4 months, and oral supplementation has not been effective in raising B12 levels. Patient reports feeling the effects of low B12. -Increase frequency of B12 injections to every two weeks for a month, then monthly.  Colonoscopy Last colonoscopy was 12  years ago. Given the patient's history of Crohn's disease, a colonoscopy is recommended every 1-3 years. -Schedule colonoscopy for December 13th at 9:00 AM.  Testosterone Patient expressed interest in having testosterone levels checked. -Add testosterone to next lab work.  General Health Maintenance / Followup Plans -Encourage patient to establish care with a primary care provider for regular check-ups. -Check B12 and testosterone levels at next lab draw. -Follow-up in 6 months or sooner if any issues arise.      The patient was provided an opportunity to ask questions and all were answered. The patient agreed with the plan and demonstrated an understanding of the instructions.  Iona Beard , MD    CC: No ref. provider found

## 2023-02-06 ENCOUNTER — Other Ambulatory Visit: Payer: Self-pay

## 2023-02-06 ENCOUNTER — Encounter: Payer: Self-pay | Admitting: Gastroenterology

## 2023-02-06 MED ORDER — CYANOCOBALAMIN 1000 MCG/ML IJ SOLN: 1000.0000 ug | INTRAMUSCULAR | 11 refills | Status: DC

## 2023-02-08 ENCOUNTER — Other Ambulatory Visit: Payer: Self-pay

## 2023-02-08 NOTE — Progress Notes (Signed)
Specialty Pharmacy Refill Coordination Note  Robert Jenkins. is a 42 y.o. male contacted today regarding refills of specialty medication(s) Adalimumab   Patient requested Delivery   Delivery date: 02/20/23   Verified address: [address] [place] [address]   Medication will be filled on 02/19/23.

## 2023-02-19 ENCOUNTER — Other Ambulatory Visit: Payer: Self-pay

## 2023-02-20 ENCOUNTER — Encounter: Payer: Self-pay | Admitting: Gastroenterology

## 2023-02-21 ENCOUNTER — Other Ambulatory Visit: Payer: Self-pay

## 2023-02-23 ENCOUNTER — Other Ambulatory Visit: Payer: Self-pay

## 2023-03-02 ENCOUNTER — Ambulatory Visit (AMBULATORY_SURGERY_CENTER): Payer: BC Managed Care – PPO | Admitting: Gastroenterology

## 2023-03-02 ENCOUNTER — Encounter: Payer: Self-pay | Admitting: Gastroenterology

## 2023-03-02 VITALS — BP 102/61 | HR 74 | Temp 98.7°F | Resp 16 | Ht 67.5 in | Wt 168.4 lb

## 2023-03-02 DIAGNOSIS — K5000 Crohn's disease of small intestine without complications: Secondary | ICD-10-CM

## 2023-03-02 DIAGNOSIS — K5080 Crohn's disease of both small and large intestine without complications: Secondary | ICD-10-CM

## 2023-03-02 DIAGNOSIS — Z1211 Encounter for screening for malignant neoplasm of colon: Secondary | ICD-10-CM | POA: Diagnosis present

## 2023-03-02 MED ORDER — CYANOCOBALAMIN 1000 MCG/ML IJ SOLN: 1000.0000 ug | INTRAMUSCULAR | 11 refills | Status: DC

## 2023-03-02 MED ORDER — SODIUM CHLORIDE 0.9 % IV SOLN: 500.0000 mL | Freq: Once | INTRAVENOUS | Status: DC

## 2023-03-02 NOTE — Progress Notes (Signed)
 Please refer to office visit note 02/02/23. No additional changes in H&P Patient is appropriate for planned procedure(s) and anesthesia in an ambulatory setting  K. Scherry Ran , MD [phone]

## 2023-03-02 NOTE — Progress Notes (Signed)
Pt in pre-procedure area with Seward Grater, RN and Joselyn Glassman, RN.  Joselyn Glassman, RN placed IV, and pt stated that he felt like he "was going to pass out". Pts  eyes rolled back and pt unable to answer questions.  RNs turned pt to side and alerted staff for support.  Vitals obtained.  Pt became alert and stated that he usually passes out when he gives blood or has an IV started.  Pt connected to dinamap for monitoring.  Will continue to monitor pt.

## 2023-03-02 NOTE — Progress Notes (Signed)
Report to PACU, RN, vss, BBS= Clear.  

## 2023-03-02 NOTE — Patient Instructions (Addendum)
Recommendation:           - Patient has a contact number available for                            emergencies. The signs and symptoms of potential                            delayed complications were discussed with the                            patient. Return to normal activities tomorrow.                            Written discharge instructions were provided to the                            patient.                           - Resume previous diet.                           - Continue present medications.                           - Await pathology results.                           - Repeat colonoscopy in 5 years for surveillance                            based on pathology results.  YOU HAD AN ENDOSCOPIC PROCEDURE TODAY AT THE [facility]:   Refer to the procedure report that was given to you for any specific questions about what was found during the examination.  If the procedure report does not answer your questions, please call your gastroenterologist to clarify.  If you requested that your care partner not be given the details of your procedure findings, then the procedure report has been included in a sealed envelope for you to review at your convenience later.  YOU SHOULD EXPECT: Some feelings of bloating in the abdomen. Passage of more gas than usual.  Walking can help get rid of the air that was put into your GI tract during the procedure and reduce the bloating. If you had a lower endoscopy (such as a colonoscopy or flexible sigmoidoscopy) you may notice spotting of blood in your stool or on the toilet paper. If you underwent a bowel prep for your procedure, you may not have a normal bowel movement for a few days.  Please Note:  You might notice some irritation and congestion in your nose or some drainage.  This is from the oxygen used during your procedure.  There is no need for concern and it should clear up in a day or so.  SYMPTOMS TO REPORT  IMMEDIATELY:  Following lower endoscopy (colonoscopy or flexible sigmoidoscopy):  Excessive amounts of blood in the stool  Significant tenderness or worsening of abdominal pains  Swelling of the abdomen that is new, acute  Fever of 100F or higher  For urgent or emergent issues, a gastroenterologist can be reached at any hour by calling [phone]. Do not use MyChart messaging for urgent concerns.    DIET:  We do recommend a small meal at first, but then you may proceed to your regular diet.  Drink plenty of fluids but you should avoid alcoholic beverages for 24 hours.  ACTIVITY:  You should plan to take it easy for the rest of today and you should NOT DRIVE or use heavy machinery until tomorrow (because of the sedation medicines used during the test).    FOLLOW UP: Our staff will call the number listed on your records the next business day following your procedure.  We will call around 7:15- 8:00 am to check on you and address any questions or concerns that you may have regarding the information given to you following your procedure. If we do not reach you, we will leave a message.     If any biopsies were taken you will be contacted by phone or by letter within the next 1-3 weeks.  Please call us at [phone] if you have not heard about the biopsies in 3 weeks.    SIGNATURES/CONFIDENTIALITY: You and/or your care partner have signed paperwork which will be entered into your electronic medical record.  These signatures attest to the fact that that the information above on your After Visit Summary has been reviewed and is understood.  Full responsibility of the confidentiality of this discharge information lies with you and/or your care-partner.

## 2023-03-02 NOTE — Progress Notes (Signed)
Called to room to assist during endoscopic procedure.  Patient ID and intended procedure confirmed with present staff. Received instructions for my participation in the procedure from the performing physician.  

## 2023-03-02 NOTE — Op Note (Signed)
[facility] Patient Name: Robert Jenkins Procedure Date: 03/02/2023 9:05 AM MRN: [id number] Endoscopist: Napoleon Form , MD, [id number] Age: 42 Referring MD:  Date of Birth: [birth year] Gender: Male Account #: [id number] Procedure:                Colonoscopy Indications:              High risk colon cancer surveillance: Crohn's                            colitis of 8 (or more) years duration with                            one-third (or more) of the colon involved Medicines:                Monitored Anesthesia Care Procedure:                Pre-Anesthesia Assessment:                           - Prior to the procedure, a History and Physical                            was performed, and patient medications and                            allergies were reviewed. The patient's tolerance of                            previous anesthesia was also reviewed. The risks                            and benefits of the procedure and the sedation                            options and risks were discussed with the patient.                            All questions were answered, and informed consent                            was obtained. Prior Anticoagulants: The patient has                            taken no anticoagulant or antiplatelet agents. ASA                            Grade Assessment: II - A patient with mild systemic                            disease. After reviewing the risks and benefits,                            the patient was deemed in satisfactory condition to  undergo the procedure.                           After obtaining informed consent, the colonoscope                            was passed under direct vision. Throughout the                            procedure, the patient's blood pressure, pulse, and                            oxygen saturations were monitored continuously. The                            [id number]-[id number] Colonoscope  [id number] was introduced                            through the anus and advanced to the the                            ileocolonic anastomosis. The colonoscopy was                            performed without difficulty. The patient tolerated                            the procedure well. The quality of the bowel                            preparation was good. The terminal ileum, ileocecal                            valve, appendiceal orifice, and rectum were                            photographed. Scope In: 9:23:00 AM Scope Out: 9:37:29 AM Scope Withdrawal Time: 0 hours 11 minutes 47 seconds  Total Procedure Duration: 0 hours 14 minutes 29 seconds  Findings:                 The perianal and digital rectal examinations were                            normal.                           There was evidence of a prior end-to-side                            ileo-colonic anastomosis in the cecum. This was                            patent and was characterized by healthy appearing  mucosa. The anastomosis was traversed.                           Patchy inflammation characterized by congestion                            (edema), erosions, erythema and friability was                            found in the neo-terminal Ileum. The inflammation                            was mild in severity. Biopsies were taken with a                            cold forceps for histology.                           Normal mucosa was found in the entire colon. Complications:            No immediate complications. Estimated Blood Loss:     Estimated blood loss was minimal. Impression:               - Patent end-to-side ileo-colonic anastomosis,                            characterized by healthy appearing mucosa.                           - Crohn's disease with ileitis. Inflammation was                            found. This was mild in severity. Biopsied.                           -  Normal mucosa in the entire examined colon. Recommendation:           - Patient has a contact number available for                            emergencies. The signs and symptoms of potential                            delayed complications were discussed with the                            patient. Return to normal activities tomorrow.                            Written discharge instructions were provided to the                            patient.                           - Resume previous diet.                           -  Continue present medications.                           - Await pathology results.                           - Repeat colonoscopy in 5 years for surveillance                            based on pathology results. Napoleon Form, MD 03/02/2023 9:43:07 AM This report has been signed electronically.

## 2023-03-02 NOTE — Progress Notes (Signed)
Pt's states no medical or surgical changes since previsit or office visit. 

## 2023-03-05 ENCOUNTER — Telehealth: Payer: Self-pay | Admitting: *Deleted

## 2023-03-05 ENCOUNTER — Other Ambulatory Visit: Payer: Self-pay

## 2023-03-05 ENCOUNTER — Telehealth: Payer: Self-pay

## 2023-03-05 NOTE — Telephone Encounter (Signed)
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  Which one is it?   I can try a letter for UC with frequent bathroom breaks for his letter for court, you will need to sign it Does that sound ok to use?

## 2023-03-05 NOTE — Telephone Encounter (Signed)
  Follow up Call-     03/02/2023    8:23 AM  Call back number  Post procedure Call Back phone  # [phone]  Permission to leave phone message Yes     Post op call attempted, no answer, left WM.

## 2023-03-06 ENCOUNTER — Encounter: Payer: Self-pay | Admitting: *Deleted

## 2023-03-06 LAB — SURGICAL PATHOLOGY

## 2023-03-06 NOTE — Telephone Encounter (Signed)
B12 sent and letter for court written

## 2023-03-07 ENCOUNTER — Encounter: Payer: Self-pay | Admitting: *Deleted

## 2023-03-09 ENCOUNTER — Other Ambulatory Visit: Payer: Self-pay

## 2023-03-09 NOTE — Progress Notes (Signed)
Specialty Pharmacy Refill Coordination Note  Sharron Langsam. is a 42 y.o. male contacted today regarding refills of specialty medication(s) Adalimumab (Humira (2 Pen))   Patient requested Delivery   Delivery date: 03/20/23   Verified address: [address] [place] [address]   Medication will be filled on 03/19/23.

## 2023-03-19 ENCOUNTER — Other Ambulatory Visit: Payer: Self-pay

## 2023-03-30 ENCOUNTER — Encounter: Payer: Self-pay | Admitting: Gastroenterology

## 2023-04-03 ENCOUNTER — Other Ambulatory Visit: Payer: Self-pay

## 2023-04-09 ENCOUNTER — Other Ambulatory Visit: Payer: Self-pay

## 2023-04-09 NOTE — Progress Notes (Signed)
Specialty Pharmacy Refill Coordination Note  Robert Jenkins. is a 43 y.o. male contacted today regarding refills of specialty medication(s) Adalimumab (Humira (2 Pen))   Patient requested Delivery   Delivery date: 04/17/23   Verified address: [address]   [place]  [address]   Medication will be filled on 04/16/23.

## 2023-04-16 ENCOUNTER — Other Ambulatory Visit: Payer: Self-pay

## 2023-05-05 ENCOUNTER — Other Ambulatory Visit: Payer: Self-pay | Admitting: Gastroenterology

## 2023-05-08 ENCOUNTER — Other Ambulatory Visit: Payer: Self-pay | Admitting: Gastroenterology

## 2023-05-09 ENCOUNTER — Other Ambulatory Visit: Payer: Self-pay

## 2023-05-09 NOTE — Progress Notes (Signed)
Specialty Pharmacy Refill Coordination Note  Robert Jenkins. is a 43 y.o. male contacted today regarding refills of specialty medication(s) Adalimumab (Humira (2 Pen))   Patient requested Delivery   Delivery date: 05/23/23   Verified address: [address]   [place] [address]   Medication will be filled on 05/22/23.

## 2023-05-22 ENCOUNTER — Other Ambulatory Visit: Payer: Self-pay

## 2023-06-12 ENCOUNTER — Other Ambulatory Visit (HOSPITAL_COMMUNITY): Payer: Self-pay

## 2023-06-14 ENCOUNTER — Other Ambulatory Visit (HOSPITAL_COMMUNITY): Payer: Self-pay

## 2023-06-14 ENCOUNTER — Other Ambulatory Visit: Payer: Self-pay

## 2023-06-14 NOTE — Progress Notes (Signed)
 Specialty Pharmacy Refill Coordination Note  Robert Jenkins. is a 43 y.o. male contacted today regarding refills of specialty medication(s) Adalimumab (Humira (2 Pen))   Patient requested Delivery   Delivery date: 06/15/23   Verified address: [address]   [place] [address]   Medication will be filled on 06/14/23.

## 2023-07-06 ENCOUNTER — Other Ambulatory Visit: Payer: Self-pay

## 2023-07-11 ENCOUNTER — Other Ambulatory Visit: Payer: Self-pay

## 2023-07-11 NOTE — Progress Notes (Signed)
 Specialty Pharmacy Refill Coordination Note  Jaramiah Bossard. is a 43 y.o. male contacted today regarding refills of specialty medication(s) Adalimumab  (Humira  (2 Pen))   Patient requested Delivery   Delivery date: 07/13/23   Verified address: [address]   [place] [address]   Medication will be filled on 07/12/23.

## 2023-07-11 NOTE — Progress Notes (Signed)
 Specialty Pharmacy Ongoing Clinical Assessment Note  Robert Jenkins. is a 43 y.o. male who is being followed by the specialty pharmacy service for RxSp Crohn's Disease   Patient's specialty medication(s) reviewed today: Adalimumab  (Humira  (2 Pen))   Missed doses in the last 4 weeks: 0   Patient/Caregiver did not have any additional questions or concerns.   Therapeutic benefit summary: Patient is achieving benefit   Adverse events/side effects summary: No adverse events/side effects   Patient's therapy is appropriate to: Continue    Goals Addressed             This Visit's Progress    Minimize recurrence of flares   On track    Patient is on track. Patient will maintain adherence          Follow up:  6 months  Douglas Rooks M Tallon Gertz Specialty Pharmacist

## 2023-07-12 ENCOUNTER — Other Ambulatory Visit: Payer: Self-pay

## 2023-08-02 ENCOUNTER — Other Ambulatory Visit: Payer: Self-pay | Admitting: Gastroenterology

## 2023-08-02 ENCOUNTER — Other Ambulatory Visit: Payer: Self-pay

## 2023-08-02 NOTE — Progress Notes (Signed)
 Specialty Pharmacy Refill Coordination Note  Robert Jenkins. is a 43 y.o. male contacted today regarding refills of specialty medication(s) Adalimumab  (Humira  (2 Pen))   Patient requested (Patient-Rptd) Delivery   Delivery date: (Patient-Rptd) 08/10/23   Verified address: (Patient-Rptd) [address] [place], [place]  [address]   Medication will be filled on 05.22.25.   Refill request pending - notify patient if delayed

## 2023-08-03 ENCOUNTER — Other Ambulatory Visit: Payer: Self-pay

## 2023-08-03 ENCOUNTER — Other Ambulatory Visit (HOSPITAL_COMMUNITY): Payer: Self-pay

## 2023-08-03 ENCOUNTER — Other Ambulatory Visit: Payer: Self-pay | Admitting: Gastroenterology

## 2023-08-03 MED ORDER — HUMIRA (2 PEN) 40 MG/0.4ML ~~LOC~~ AJKT
40.0000 mg | AUTO-INJECTOR | SUBCUTANEOUS | 6 refills | Status: DC
  Filled 2023-08-03: qty 2, 28d supply, fill #0
  Filled 2023-09-04: qty 2, 28d supply, fill #1
  Filled 2023-10-19: qty 2, 28d supply, fill #2
  Filled 2023-11-13: qty 2, 28d supply, fill #3
  Filled 2023-12-19 – 2024-01-08 (×4): qty 2, 28d supply, fill #4
  Filled 2024-01-29: qty 2, 28d supply, fill #5
  Filled 2024-03-07: qty 2, 28d supply, fill #6

## 2023-08-03 NOTE — Telephone Encounter (Signed)
 Robert Jenkins, Is [place] good to go for his Humira  refills?

## 2023-08-09 ENCOUNTER — Other Ambulatory Visit: Payer: Self-pay

## 2023-09-04 ENCOUNTER — Encounter: Payer: Self-pay | Admitting: Gastroenterology

## 2023-09-04 ENCOUNTER — Encounter (INDEPENDENT_AMBULATORY_CARE_PROVIDER_SITE_OTHER): Payer: Self-pay

## 2023-09-04 ENCOUNTER — Other Ambulatory Visit (HOSPITAL_COMMUNITY): Payer: Self-pay

## 2023-09-04 ENCOUNTER — Other Ambulatory Visit: Payer: Self-pay

## 2023-09-04 NOTE — Progress Notes (Signed)
 Specialty Pharmacy Refill Coordination Note  MyChart Questionnaire Submission  Robert Jenkins. is a 43 y.o. male contacted today regarding refills of specialty medication(s) No data recorded  Injection date: 10/02/23.   Patient requested: (Patient-Rptd) Delivery   Delivery date: 09/06/23   Verified address: (Patient-Rptd) [address] [place] [address]  Medication will be filled on 09/05/23.

## 2023-10-19 ENCOUNTER — Encounter (INDEPENDENT_AMBULATORY_CARE_PROVIDER_SITE_OTHER): Payer: Self-pay

## 2023-10-19 ENCOUNTER — Other Ambulatory Visit: Payer: Self-pay

## 2023-10-19 NOTE — Progress Notes (Signed)
 Specialty Pharmacy Refill Coordination Note  Robert Jenkins. is a 43 y.o. male contacted today regarding refills of specialty medication(s) Adalimumab  (Humira  (2 Pen))   Patient requested (Patient-Rptd) Delivery   Delivery date: 10/24/23   Verified address: (Patient-Rptd) [address] [place] [address]   Medication will be filled on 10/23/23.

## 2023-10-22 ENCOUNTER — Other Ambulatory Visit: Payer: Self-pay

## 2023-10-24 ENCOUNTER — Other Ambulatory Visit (HOSPITAL_COMMUNITY): Payer: Self-pay

## 2023-11-13 ENCOUNTER — Other Ambulatory Visit: Payer: Self-pay

## 2023-11-13 ENCOUNTER — Encounter (INDEPENDENT_AMBULATORY_CARE_PROVIDER_SITE_OTHER): Payer: Self-pay

## 2023-11-13 NOTE — Progress Notes (Signed)
 Specialty Pharmacy Refill Coordination Note  Robert Ayo. is a 43 y.o. male contacted today regarding refills of specialty medication(s) Adalimumab  (Humira  (2 Pen))   Patient requested (Patient-Rptd) Delivery   Delivery date: 11/29/23   Verified address: (Patient-Rptd) [address] [name] [address]   Medication will be filled on 11/28/23.

## 2023-11-28 ENCOUNTER — Other Ambulatory Visit: Payer: Self-pay

## 2023-12-19 ENCOUNTER — Encounter (INDEPENDENT_AMBULATORY_CARE_PROVIDER_SITE_OTHER): Payer: Self-pay

## 2023-12-19 ENCOUNTER — Other Ambulatory Visit: Payer: Self-pay | Admitting: Pharmacy Technician

## 2023-12-19 ENCOUNTER — Other Ambulatory Visit: Payer: Self-pay

## 2023-12-19 NOTE — Progress Notes (Signed)
 Specialty Pharmacy Refill Coordination Note  Robert Jenkins. is a 43 y.o. male contacted today regarding refills of specialty medication(s) Adalimumab  (Humira  (2 Pen))   Patient requested (Patient-Rptd) Delivery   Delivery date: 12/27/23  Verified address: (Patient-Rptd) [address] [place] [address]   Medication will be filled on 12/26/23.

## 2023-12-26 ENCOUNTER — Other Ambulatory Visit: Payer: Self-pay

## 2023-12-27 ENCOUNTER — Other Ambulatory Visit: Payer: Self-pay

## 2023-12-28 ENCOUNTER — Other Ambulatory Visit: Payer: Self-pay

## 2023-12-31 ENCOUNTER — Other Ambulatory Visit (HOSPITAL_COMMUNITY): Payer: Self-pay

## 2023-12-31 ENCOUNTER — Telehealth: Payer: Self-pay

## 2023-12-31 NOTE — Telephone Encounter (Signed)
 Pharmacy Patient Advocate Encounter  Received information regarding co-pay for patient. Upon investigation, patient was removed from benefits program at some point and marked as 'off therapy'. I have changed this information and updated his profile, an ambassador should be reaching out and benefits are in the pending process.

## 2024-01-01 ENCOUNTER — Other Ambulatory Visit: Payer: Self-pay

## 2024-01-02 ENCOUNTER — Other Ambulatory Visit: Payer: Self-pay

## 2024-01-02 NOTE — Progress Notes (Signed)
 This fill has been canceled due to high cost pending update from patient advocate.

## 2024-01-03 ENCOUNTER — Other Ambulatory Visit (HOSPITAL_COMMUNITY): Payer: Self-pay

## 2024-01-07 ENCOUNTER — Telehealth: Payer: Self-pay

## 2024-01-07 ENCOUNTER — Other Ambulatory Visit: Payer: Self-pay

## 2024-01-07 ENCOUNTER — Other Ambulatory Visit (HOSPITAL_COMMUNITY): Payer: Self-pay

## 2024-01-07 NOTE — Telephone Encounter (Signed)
 Pharmacy Patient Advocate Encounter   Received notification from Pt Calls Messages that prior authorization for Humira  (2 Pen) (CF) 40MG /0.4ML auto-injector kit is required/requested.   Insurance verification completed.   The patient is insured through [facility].   Per test claim: PA required; PA submitted to above mentioned insurance via Latent Key/confirmation #/EOC [id number] Status is pending

## 2024-01-07 NOTE — Telephone Encounter (Signed)
 Pharmacy Patient Advocate Encounter  Received notification from [facility] that Prior Authorization for Humira  (2 Pen) (CF) 40MG /0.4ML auto-injector kit has been APPROVED from 01-07-2024 to 01-06-2025   PA #/Case ID/Reference #: [id number]

## 2024-01-08 ENCOUNTER — Other Ambulatory Visit: Payer: Self-pay

## 2024-01-08 NOTE — Progress Notes (Signed)
 Specialty Pharmacy Refill Coordination Note  Robert Couper. is a 43 y.o. male contacted today regarding refills of specialty medication(s) Adalimumab  (Humira  (2 Pen))   Patient requested Delivery   Delivery date: 01/09/24   Verified address: [address] [place] [address]   Medication will be filled on 01/08/24.

## 2024-01-29 ENCOUNTER — Other Ambulatory Visit: Payer: Self-pay

## 2024-01-29 ENCOUNTER — Other Ambulatory Visit (HOSPITAL_COMMUNITY): Payer: Self-pay

## 2024-01-29 ENCOUNTER — Encounter (INDEPENDENT_AMBULATORY_CARE_PROVIDER_SITE_OTHER): Payer: Self-pay

## 2024-01-29 NOTE — Progress Notes (Signed)
 Specialty Pharmacy Refill Coordination Note  MyChart Questionnaire Submission  Robert Jenkins. is a 43 y.o. male contacted today regarding refills of specialty medication(s) Humira .  Doses on hand: (Patient-Rptd) 1   Injection date: (Patient-Rptd) 02/03/24  Patient requested: (Patient-Rptd) Delivery   Delivery date: 01/31/24  Verified address: [address] [place] [address]  Medication will be filled on 01/30/24.

## 2024-03-07 ENCOUNTER — Other Ambulatory Visit: Payer: Self-pay

## 2024-03-07 ENCOUNTER — Other Ambulatory Visit: Payer: Self-pay | Admitting: Pharmacy Technician

## 2024-03-07 ENCOUNTER — Encounter (INDEPENDENT_AMBULATORY_CARE_PROVIDER_SITE_OTHER): Payer: Self-pay

## 2024-03-07 NOTE — Progress Notes (Signed)
 Specialty Pharmacy Refill Coordination Note  Delmos Velaquez. is a 44 y.o. male contacted today regarding refills of specialty medication(s) Adalimumab  (Humira  (2 Pen))   Patient requested Delivery   Delivery date: 03/11/24   Verified address: [address] [place] [address]   Medication will be filled on: 03/10/24

## 2024-03-19 ENCOUNTER — Other Ambulatory Visit: Payer: Self-pay | Admitting: Gastroenterology

## 2024-03-31 ENCOUNTER — Other Ambulatory Visit: Payer: Self-pay | Admitting: Gastroenterology

## 2024-03-31 ENCOUNTER — Other Ambulatory Visit: Payer: Self-pay

## 2024-03-31 ENCOUNTER — Other Ambulatory Visit (HOSPITAL_COMMUNITY): Payer: Self-pay

## 2024-03-31 MED ORDER — HUMIRA (2 PEN) 40 MG/0.4ML ~~LOC~~ AJKT
40.0000 mg | AUTO-INJECTOR | SUBCUTANEOUS | 0 refills | Status: AC
  Filled 2024-03-31 – 2024-04-01 (×2): qty 0.8, 28d supply, fill #0

## 2024-04-01 ENCOUNTER — Encounter (INDEPENDENT_AMBULATORY_CARE_PROVIDER_SITE_OTHER): Payer: Self-pay

## 2024-04-01 ENCOUNTER — Other Ambulatory Visit: Payer: Self-pay

## 2024-04-02 ENCOUNTER — Other Ambulatory Visit: Payer: Self-pay | Admitting: Pharmacy Technician

## 2024-04-02 ENCOUNTER — Other Ambulatory Visit: Payer: Self-pay

## 2024-04-02 NOTE — Progress Notes (Signed)
 Specialty Pharmacy Refill Coordination Note  Robert Jenkins. is a 44 y.o. male contacted today regarding refills of specialty medication(s) Adalimumab  (Humira  (2 Pen))   Patient requested Delivery   Delivery date: 04/09/24   Verified address: [address] [place] [address]   Medication will be filled on: 04/08/24

## 2024-04-08 ENCOUNTER — Other Ambulatory Visit: Payer: Self-pay

## 2024-04-10 ENCOUNTER — Other Ambulatory Visit: Payer: Self-pay

## 2024-04-14 ENCOUNTER — Other Ambulatory Visit: Payer: Self-pay

## 2024-04-15 ENCOUNTER — Other Ambulatory Visit: Payer: Self-pay

## 2024-04-15 NOTE — Progress Notes (Signed)
 Specialty Pharmacy Ongoing Clinical Assessment Note  Robert Jenkins. is a 44 y.o. male who is being followed by the specialty pharmacy service for RxSp Crohn's Disease   Patient's specialty medication(s) reviewed today: Adalimumab  (Humira  (2 Pen))   Missed doses in the last 4 weeks: 0   Patient/Caregiver did not have any additional questions or concerns.   Therapeutic benefit summary: Patient is achieving benefit   Adverse events/side effects summary: No adverse events/side effects   Patient's therapy is appropriate to: Continue    Goals Addressed             This Visit's Progress    Minimize recurrence of flares   On track    Patient is on track. Patient will maintain adherence          Follow up: 12 months  Silvano LOISE Dolly Specialty Pharmacist

## 2024-06-06 ENCOUNTER — Ambulatory Visit: Admitting: Gastroenterology
